# Patient Record
Sex: Female | Born: 1935 | Race: Asian | Hispanic: No | Smoking: Never smoker
Health system: Southern US, Community
[De-identification: ages and names within clinical notes are randomized; demographics above are authoritative.]

## PROBLEM LIST (undated history)

## (undated) DIAGNOSIS — E039 Hypothyroidism, unspecified: Secondary | ICD-10-CM

## (undated) DIAGNOSIS — M1990 Unspecified osteoarthritis, unspecified site: Secondary | ICD-10-CM

## (undated) DIAGNOSIS — J45909 Unspecified asthma, uncomplicated: Secondary | ICD-10-CM

## (undated) HISTORY — PX: EYE SURGERY: SHX253

## (undated) HISTORY — DX: Hypothyroidism, unspecified: E03.9

## (undated) HISTORY — PX: SPINE SURGERY: SHX786

## (undated) HISTORY — PX: APPENDECTOMY: SHX54

## (undated) HISTORY — DX: Unspecified osteoarthritis, unspecified site: M19.90

## (undated) HISTORY — DX: Unspecified asthma, uncomplicated: J45.909

---

## 2015-12-31 ENCOUNTER — Encounter: Payer: Self-pay | Admitting: *Deleted

## 2016-06-28 ENCOUNTER — Ambulatory Visit: Payer: Self-pay

## 2016-11-30 ENCOUNTER — Encounter: Payer: Self-pay | Admitting: *Deleted

## 2016-11-30 DIAGNOSIS — Z23 Encounter for immunization: Secondary | ICD-10-CM

## 2017-04-15 ENCOUNTER — Ambulatory Visit: Payer: BLUE CROSS/BLUE SHIELD | Admitting: Physician Assistant

## 2017-04-15 ENCOUNTER — Other Ambulatory Visit: Payer: Self-pay

## 2017-04-15 ENCOUNTER — Encounter: Payer: Self-pay | Admitting: Physician Assistant

## 2017-04-15 VITALS — BP 112/60 | HR 83 | Temp 98.1°F | Resp 18 | Wt 107.0 lb

## 2017-04-15 DIAGNOSIS — G479 Sleep disorder, unspecified: Secondary | ICD-10-CM | POA: Diagnosis not present

## 2017-04-15 DIAGNOSIS — R198 Other specified symptoms and signs involving the digestive system and abdomen: Secondary | ICD-10-CM | POA: Diagnosis not present

## 2017-04-15 DIAGNOSIS — Z7689 Persons encountering health services in other specified circumstances: Secondary | ICD-10-CM

## 2017-04-15 DIAGNOSIS — E039 Hypothyroidism, unspecified: Secondary | ICD-10-CM

## 2017-04-15 DIAGNOSIS — K219 Gastro-esophageal reflux disease without esophagitis: Secondary | ICD-10-CM | POA: Diagnosis not present

## 2017-04-15 NOTE — Patient Instructions (Addendum)
It was a pleasure meeting you today.  For your stomach issues, I placed a referral for gastroenterologist.  They should contact you within 1-2 weeks to schedule this appointment.  Please discuss with them the medications that you were on in [place] and for what reasons.  Otherwise, I have given you some information below about GERD and constipation.   For other issues, I recommend you return in 1-2 weeks for a complete physical exam.  It is best that you come when you are fasting, no food for 8 hours prior.  We will obtain labs and update your health maintenance requirements at this time.  We will also refill any medications you need at that time.  If you could bring any records you have from [place] or any immunization records, that would be great.  Thank you for letting me participate in your health and well being.    Food Choices for Gastroesophageal Reflux Disease, Adult When you have gastroesophageal reflux disease (GERD), the foods you eat and your eating habits are very important. Choosing the right foods can help ease your discomfort. What guidelines do I need to follow?  Choose fruits, vegetables, whole grains, and low-fat dairy products.  Choose low-fat meat, fish, and poultry.  Limit fats such as oils, salad dressings, butter, nuts, and avocado.  Keep a food diary. This helps you identify foods that cause symptoms.  Avoid foods that cause symptoms. These may be different for everyone.  Eat small meals often instead of 3 large meals a day.  Eat your meals slowly, in a place where you are relaxed.  Limit fried foods.  Cook foods using methods other than frying.  Avoid drinking alcohol.  Avoid drinking large amounts of liquids with your meals.  Avoid bending over or lying down until 2-3 hours after eating. What foods are not recommended? These are some foods and drinks that may make your symptoms worse: Vegetables Tomatoes. Tomato juice. Tomato and spaghetti sauce.  Chili peppers. Onion and garlic. Horseradish. Fruits Oranges, grapefruit, and lemon (fruit and juice). Meats High-fat meats, fish, and poultry. This includes hot dogs, ribs, ham, sausage, salami, and bacon. Dairy Whole milk and chocolate milk. Sour cream. Cream. Butter. Ice cream. Cream cheese. Drinks Coffee and tea. Bubbly (carbonated) drinks or energy drinks. Condiments Hot sauce. Barbecue sauce. Sweets/Desserts Chocolate and cocoa. Donuts. Peppermint and spearmint. Fats and Oils High-fat foods. This includes [place] fries and potato chips. Other Vinegar. Strong spices. This includes black pepper, white pepper, red pepper, cayenne, curry powder, cloves, ginger, and chili powder. The items listed above may not be a complete list of foods and drinks to avoid. Contact your dietitian for more information. This information is not intended to replace advice given to you by your health care provider. Make sure you discuss any questions you have with your health care provider. Document Released: 09/02/2011 Document Revised: 08/09/2015 Document Reviewed: 01/05/2013 Elsevier Interactive Patient Education  2017 [employer].  Constipation, Adult Constipation is when a person:  Poops (has a bowel movement) fewer times in a week than normal.  Has a hard time pooping.  Has poop that is dry, hard, or bigger than normal.  Follow these instructions at home: Eating and drinking   Eat foods that have a lot of fiber, such as: ? Fresh fruits and vegetables. ? Whole grains. ? Beans.  Eat less of foods that are high in fat, low in fiber, or overly processed, such as: ? [place] fries. ? Hamburgers. ? Cookies. ?  Candy. ? Soda.  Drink enough fluid to keep your pee (urine) clear or pale yellow. General instructions  Exercise regularly or as told by your doctor.  Go to the restroom when you feel like you need to poop. Do not hold it in.  Take over-the-counter and prescription medicines  only as told by your doctor. These include any fiber supplements.  Do pelvic floor retraining exercises, such as: ? Doing deep breathing while relaxing your lower belly (abdomen). ? Relaxing your pelvic floor while pooping.  Watch your condition for any changes.  Keep all follow-up visits as told by your doctor. This is important. Contact a doctor if:  You have pain that gets worse.  You have a fever.  You have not pooped for 4 days.  You throw up (vomit).  You are not hungry.  You lose weight.  You are bleeding from the anus.  You have thin, pencil-like poop (stool). Get help right away if:  You have a fever, and your symptoms suddenly get worse.  You leak poop or have blood in your poop.  Your belly feels hard or bigger than normal (is bloated).  You have very bad belly pain.  You feel dizzy or you faint. This information is not intended to replace advice given to you by your health care provider. Make sure you discuss any questions you have with your health care provider. Document Released: 08/20/2007 Document Revised: 09/21/2015 Document Reviewed: 08/22/2015 Elsevier Interactive Patient Education  2018 [employer].  IF you received an x-ray today, you will receive an invoice from [facility]. Please contact [facility] at [phone] with questions or concerns regarding your invoice.   IF you received labwork today, you will receive an invoice from [facility]. Please contact [facility] at [phone] with questions or concerns regarding your invoice.   Our billing staff will not be able to assist you with questions regarding bills from these companies.  You will be contacted with the lab results as soon as they are available. The fastest way to get your results is to activate your My Chart account. Instructions are located on the last page of this paperwork. If you have not heard from [name] regarding the results in 2 weeks, please contact this  office.

## 2017-04-15 NOTE — Progress Notes (Signed)
Kendra Dickson  MRN: [id number] DOB: Nov 06, 1930  Subjective:  Kendra Dickson is a 84 y.o. female seen in office today for a chief complaint of to establish care. She moved here from [place] in 2017. Received all of her medications from a doctor when she first arrived but then never followed up to establish with a PCP. Of note, pt is a poor historian. Her daughter-in-law is present to translate.Patient lives at home with son and daughter-in-law.  Chronic medical conditions she is currently taking medication for her listed below: 1) Hypothyroidism: Dx made about 10 years ago. Takes Synthroid 50 mcg daily.    2) Bowel issues: Has history of constipation, diarrhea, and GERD.  Was on metoclopramide TID x years in [place] but is not sure why? She has been out of medication for one day.Has been having at least 2 bowel movements daily. Also takes omeprazole 20mg  x >1 year. She does have heartburn especially if she eats spicy food.. She had an established GI doctor in [place]. Had endoscopy in the past about 2 years ago and notes it was normal.  Diet consists of rice and soup. Only drinks one cup of water a day.  Denies abdominal pain, nausea, and vomiting.   3) Sleep issues: Takes trazodone 50mg  at night for sleep disturbance.  Has done this for years.  Notes that the helps tremendously with sleep.    Review of Systems  Constitutional: Negative for chills and fever.  Respiratory: Negative for cough and shortness of breath.   Musculoskeletal: Negative for gait problem.  Neurological: Negative for dizziness.    There are no active problems to display for this patient.   Current Outpatient Medications on File Prior to Visit  Medication Sig Dispense Refill  . aspirin 81 MG chewable tablet Chew by mouth daily.    . Cholecalciferol (D3-1000) 1000 units capsule Take 1,000 Units by mouth daily.    Marland Kitchen levothyroxine (SYNTHROID, LEVOTHROID) 50 MCG tablet Take 50 mcg by mouth daily before breakfast.    .  Lutein-Zeaxanthin 25-5 MG CAPS Take by mouth.    . magnesium oxide (MAG-OX) 400 MG tablet Take 400 mg by mouth daily.    . metoCLOPramide (REGLAN) 10 MG tablet Take 10 mg by mouth 3 (three) times daily.    Marland Kitchen omeprazole (PRILOSEC) 20 MG capsule Take 20 mg by mouth daily.    . traZODone (DESYREL) 50 MG tablet Take 50 mg by mouth at bedtime.     No current facility-administered medications on file prior to visit.     No Known Allergies   Objective:  BP 112/60   Pulse 83   Temp 98.1 F (36.7 C) (Oral)   Resp 18   Wt 107 lb (48.5 kg)   SpO2 94%   Physical Exam  Constitutional: She is oriented to person, place, and time and well-developed, well-nourished, and in no distress.  HENT:  Head: Normocephalic and atraumatic.  Eyes: Conjunctivae are normal.  Neck: Normal range of motion.  Pulmonary/Chest: Effort normal.  Abdominal: Soft. Normal appearance and bowel sounds are normal. There is no tenderness.  Neurological: She is alert and oriented to person, place, and time. Gait normal.  Skin: Skin is warm and dry.  Psychiatric: Affect normal.  Vitals reviewed.   Assessment and Plan :  1. Gastroesophageal reflux disease, esophagitis presence not specified Due to extensive bowel issues and long-term use of both metoclopramide and omeprazole, patient was consistently followed by GI in [place] would likely benefit from  establishing with a GI doctor here.  Of note, patient is a poor historian.  Referral placed. - Ambulatory referral to Gastroenterology 2. Alternating constipation and diarrhea - Ambulatory referral to Gastroenterology 3. Hypothyroidism, unspecified type 4. Sleep disturbance 5. Encounter to establish care We are happy to take over as patient's primary care office.  Recommended she follow-up within the next 1-2 weeks when she is fasting for CPE.  Educated daughter-in-law that if she could bring in any prior medical records or immunization records at that visit, that would be  extremely helpful.  Tenna Delaine PA-C  [facility] 04/17/2017 9:32 AM

## 2017-04-17 ENCOUNTER — Encounter: Payer: Self-pay | Admitting: Physician Assistant

## 2017-04-27 ENCOUNTER — Ambulatory Visit: Payer: BLUE CROSS/BLUE SHIELD | Admitting: Physician Assistant

## 2017-04-28 ENCOUNTER — Encounter: Payer: Self-pay | Admitting: Gastroenterology

## 2017-04-29 ENCOUNTER — Ambulatory Visit (INDEPENDENT_AMBULATORY_CARE_PROVIDER_SITE_OTHER): Payer: BLUE CROSS/BLUE SHIELD | Admitting: Physician Assistant

## 2017-04-29 ENCOUNTER — Encounter: Payer: Self-pay | Admitting: Physician Assistant

## 2017-04-29 ENCOUNTER — Other Ambulatory Visit: Payer: Self-pay

## 2017-04-29 VITALS — BP 122/56 | HR 76 | Temp 97.6°F | Resp 16 | Ht <= 58 in | Wt 104.4 lb

## 2017-04-29 DIAGNOSIS — E039 Hypothyroidism, unspecified: Secondary | ICD-10-CM

## 2017-04-29 DIAGNOSIS — Z0000 Encounter for general adult medical examination without abnormal findings: Secondary | ICD-10-CM

## 2017-04-29 DIAGNOSIS — G479 Sleep disorder, unspecified: Secondary | ICD-10-CM | POA: Diagnosis not present

## 2017-04-29 DIAGNOSIS — Z13220 Encounter for screening for lipoid disorders: Secondary | ICD-10-CM | POA: Diagnosis not present

## 2017-04-29 DIAGNOSIS — Z13228 Encounter for screening for other metabolic disorders: Secondary | ICD-10-CM

## 2017-04-29 DIAGNOSIS — E2839 Other primary ovarian failure: Secondary | ICD-10-CM

## 2017-04-29 DIAGNOSIS — Z23 Encounter for immunization: Secondary | ICD-10-CM

## 2017-04-29 DIAGNOSIS — M810 Age-related osteoporosis without current pathological fracture: Secondary | ICD-10-CM | POA: Diagnosis not present

## 2017-04-29 DIAGNOSIS — Z1389 Encounter for screening for other disorder: Secondary | ICD-10-CM

## 2017-04-29 DIAGNOSIS — Z130 Encounter for screening for diseases of the blood and blood-forming organs and certain disorders involving the immune mechanism: Secondary | ICD-10-CM

## 2017-04-29 MED ORDER — TRAZODONE HCL 50 MG PO TABS: 50.0000 mg | ORAL_TABLET | Freq: Every day | ORAL | 1 refills | Status: DC

## 2017-04-29 MED ORDER — LEVOTHYROXINE SODIUM 50 MCG PO TABS: 50.0000 ug | ORAL_TABLET | Freq: Every day | ORAL | 1 refills | Status: DC

## 2017-04-29 NOTE — Progress Notes (Addendum)
Kendra Dickson  MRN: [id number] DOB: 13-Nov-1930  Subjective:  Pt is a 84 y.o. postmenopausal female who presents for annual physical exam. Pt is not fasting today.   Diet: Eats a good mix of rice, meats, and vegetables. Eats yogurt regularly. Takes OTC vitamin d and calcium supplementation. Drinks juice and water.   Exercise: Walks around a few times a day outside.  Last annual exam: Years ago Last dental exam: 2018, brushes twice daily. Has lower dentures.  Last vision exam:  2018, wears eyeglasses  Last pap smear: Years ago Last mammogram: 2009 Last colonoscopy: 2017 Vaccinations      Tetanus: 2017       Zostavax: Never      Influenza: 11/2016      Pneumococcal: notes she had one shot in [place] but does not remember which one  Chronic issues: 1) Osteoporosis: Was on shots in [place] x 4 years. Came here 17 months ago, started on an oral medication but cannot recall which one. Did not tolerate it due to stomach issues, and therefore stopped taking it. Has not had a DEXA in 4 years. 2) Hypothyroidism: controlled on synthroid 66mg. Has not bee out of medication.  3) asthma: does not require an inhaler, much better now [place] moved to [place] 4) Sleep issues: Takes trazodone '50mg'$  x 6 months. Helps a lot. Gets 4-5 hours a night.  5) Bowel issues: Has history of constipation, diarrhea, and GERD.  Was [place] metoclopramide 3 times daily times [place] why.  Takes omeprazole 20 mg daily.  [place]  Has not established one here yet but was given referral at her last visit.  Last endoscopy was 2 years ago and she notes was normal.  Has follow up with GI in 05/2017.  There are no active problems to display for this patient.   Current Outpatient Medications on File Prior to Visit  Medication Sig Dispense Refill  . aspirin 81 MG chewable tablet Chew by mouth daily.    . Cholecalciferol (D3-1000) 1000 units capsule Take 1,000 Units by mouth daily.    .  Lutein-Zeaxanthin 25-5 MG CAPS Take by mouth.    . magnesium oxide (MAG-OX) 400 MG tablet Take 400 mg by mouth daily.    . metoCLOPramide (REGLAN) 10 MG tablet Take 10 mg by mouth 3 (three) times daily.    .Marland Kitchenomeprazole (PRILOSEC) 20 MG capsule Take 20 mg by mouth daily.     No current facility-administered medications on file prior to visit.     No Known Allergies  Social History   Socioeconomic History  . Marital status: Widowed    Spouse name: None  . Number of children: 3  . Years of education: None  . Highest education level: None  Social Needs  . Financial resource strain: Not hard at all  . Food insecurity - worry: Never true  . Food insecurity - inability: Never true  . Transportation needs - medical: No  . Transportation needs - non-medical: No  Occupational History  . None  Tobacco Use  . Smoking status: Never Smoker  . Smokeless tobacco: Never Used  Substance and Sexual Activity  . Alcohol use: No    Frequency: Never  . Drug use: No  . Sexual activity: No  Other Topics Concern  . None  Social History Narrative   [place]. Lives with son and daughter in law. Has  three children.     Past Surgical History:  Procedure Laterality Date  . APPENDECTOMY    . CESAREAN SECTION    . EYE SURGERY    . SPINE SURGERY      Family History  Problem Relation Age of Onset  . Cancer Father        Gallbladder  . Cancer Sister        Gallbladder   Fall Risk  04/29/2017 04/15/2017  Falls in the past year? No No    Review of Systems  Constitutional: Negative for activity change, appetite change, chills, diaphoresis, fatigue, fever and unexpected weight change.  HENT: Positive for dental problem (has dentures) and hearing loss (used to use hearing aids but did not like them). Negative for congestion, drooling, ear discharge, ear pain, facial swelling, mouth sores, nosebleeds, postnasal drip, rhinorrhea, sinus pressure, sinus pain, sneezing, sore  throat, tinnitus, trouble swallowing and voice change.   Eyes: Positive for visual disturbance (has Rx eyeglasses). Negative for photophobia, pain, discharge, redness and itching.  Respiratory: Negative for apnea, cough, choking, chest tightness, shortness of breath, wheezing and stridor.   Cardiovascular: Negative for chest pain, palpitations and leg swelling.  Gastrointestinal: Positive for diarrhea. Negative for abdominal distention, abdominal pain, anal bleeding, blood in stool, constipation, nausea, rectal pain and vomiting.  Endocrine: Negative for cold intolerance, heat intolerance, polydipsia, polyphagia and polyuria.  Genitourinary: Negative for decreased urine volume, difficulty urinating, dyspareunia, dysuria, enuresis, flank pain, frequency, genital sores, hematuria, menstrual problem, pelvic pain, urgency, vaginal bleeding, vaginal discharge and vaginal pain.  Musculoskeletal: Negative for arthralgias, back pain, gait problem, joint swelling, myalgias, neck pain and neck stiffness.  Skin: Negative for color change, pallor, rash and wound.  Allergic/Immunologic: Negative for environmental allergies, food allergies and immunocompromised state.  Neurological: Negative for dizziness, tremors, seizures, syncope, facial asymmetry, speech difficulty, weakness, light-headedness, numbness and headaches.  Hematological: Negative for adenopathy. Does not bruise/bleed easily.  Psychiatric/Behavioral: Negative for agitation, behavioral problems, confusion, decreased concentration, dysphoric mood, hallucinations, self-injury, sleep disturbance and suicidal ideas. The patient is not nervous/anxious and is not hyperactive.     Objective:  BP (!) 122/56   Pulse 76   Temp 97.6 F (36.4 C) (Oral)   Resp 16   Ht 4' 9.28" (1.455 m)   Wt 104 lb 6.4 oz (47.4 kg)   LMP  (Approximate)   SpO2 95%   BMI 22.37 kg/m   Physical Exam  Constitutional: She is oriented to person, place, and time and  well-developed, well-nourished, and in no distress.  HENT:  Head: Normocephalic and atraumatic.  Right Ear: Hearing, tympanic membrane, external ear and ear canal normal.  Left Ear: Hearing, tympanic membrane, external ear and ear canal normal.  Nose: Nose normal.  Mouth/Throat: Uvula is midline, oropharynx is clear and moist and mucous membranes are normal. No oropharyngeal exudate.  Eyes: Conjunctivae, EOM and lids are normal. Pupils are equal, round, and reactive to light. No scleral icterus.  Neck: Trachea normal and normal range of motion. No thyroid mass and no thyromegaly present.  Cardiovascular: Normal rate, regular rhythm, normal heart sounds and intact distal pulses.  Pulmonary/Chest: Effort normal and breath sounds normal.  Abdominal: Soft. Normal appearance and bowel sounds are normal. There is no tenderness.  Lymphadenopathy:       Head (right side): No tonsillar, no preauricular, no posterior auricular and no occipital adenopathy present.       Head (left side): No tonsillar, no preauricular, no posterior auricular and no  occipital adenopathy present.    She has no cervical adenopathy.       Right: No supraclavicular adenopathy present.       Left: No supraclavicular adenopathy present.  Neurological: She is alert and oriented to person, place, and time. She has normal sensation, normal strength and normal reflexes. Gait normal.  30 second chair stand test score of 10.  Skin: Skin is warm and dry.  Psychiatric: Affect normal.   No exam data present   Assessment and Plan :  Discussed healthy lifestyle, diet, exercise, preventative care, vaccinations, and addressed patient's concerns. Plan for follow up in 6 months. Otherwise, plan for specific conditions below.  1. Annual physical exam Await lab results.  2. Screening, anemia, deficiency, iron - CBC with Differential  3. Screening for metabolic disorder - [id number]  4. Screening cholesterol level - Lipid  Panel  5. Screening for hematuria or proteinuria - Urinalysis, dipstick only  6. Hypothyroidism, unspecified type Follow-up in 6 months. - TSH - levothyroxine (SYNTHROID, LEVOTHROID) 50 MCG tablet; Take 1 tablet (50 mcg total) by mouth daily before breakfast.  Dispense: 90 tablet; Refill: 1 - [facility] Bone Density; Future  7. Osteoporosis, unspecified osteoporosis type, unspecified pathological fracture presence - [facility] Bone Density; Future - VITAMIN D 25 Hydroxy (Vit-D Deficiency, Fractures)  8. Need for pneumococcal vaccination Will give pneumovax 23 in one year.  - Pneumococcal conjugate vaccine 13-valent  9. Sleep disturbance Controlled.  - traZODone (DESYREL) 50 MG tablet; Take 1 tablet (50 mg total) by mouth at bedtime.  Dispense: 90 tablet; Refill: 1  10. Estrogen deficiency - [facility]; Future   Tenna Delaine, PA-C  [facility] at [facility] 04/29/2017 6:44 PM

## 2017-04-29 NOTE — Patient Instructions (Addendum)
We have collected labs and should have those results back in 1 week.  We will contact you with these results.  In the meantime, continue with your current medications.  I have given you refills on Synthroid and trazodone.  Follow-up with GI as planned.  We have also ordered a bone density today and they should contact you within 1-2 weeks to schedule this.  Plan to follow-up in 6 months for reevaluation.  Health Maintenance for Postmenopausal Women Menopause is a normal process in which your reproductive ability comes to an end. This process happens gradually over a span of months to years, usually between the ages of 42 and 61. Menopause is complete when you have missed 12 consecutive menstrual periods. It is important to talk with your health care provider about some of the most common conditions that affect postmenopausal women, such as heart disease, cancer, and bone loss (osteoporosis). Adopting a healthy lifestyle and getting preventive care can help to promote your health and wellness. Those actions can also lower your chances of developing some of these common conditions. What should I know about menopause? During menopause, you may experience a number of symptoms, such as:  Moderate-to-severe hot flashes.  Night sweats.  Decrease in sex drive.  Mood swings.  Headaches.  Tiredness.  Irritability.  Memory problems.  Insomnia.  Choosing to treat or not to treat menopausal changes is an individual decision that you make with your health care provider. What should I know about hormone replacement therapy and supplements? Hormone therapy products are effective for treating symptoms that are associated with menopause, such as hot flashes and night sweats. Hormone replacement carries certain risks, especially as you become older. If you are thinking about using estrogen or estrogen with progestin treatments, discuss the benefits and risks with your health care provider. What should I  know about heart disease and stroke? Heart disease, heart attack, and stroke become more likely as you age. This may be due, in part, to the hormonal changes that your body experiences during menopause. These can affect how your body processes dietary fats, triglycerides, and cholesterol. Heart attack and stroke are both medical emergencies. There are many things that you can do to help prevent heart disease and stroke:  Have your blood pressure checked at least every 1-2 years. High blood pressure causes heart disease and increases the risk of stroke.  If you are 25-28 years old, ask your health care provider if you should take aspirin to prevent a heart attack or a stroke.  Do not use any tobacco products, including cigarettes, chewing tobacco, or electronic cigarettes. If you need help quitting, ask your health care provider.  It is important to eat a healthy diet and maintain a healthy weight. ? Be sure to include plenty of vegetables, fruits, low-fat dairy products, and lean protein. ? Avoid eating foods that are high in solid fats, added sugars, or salt (sodium).  Get regular exercise. This is one of the most important things that you can do for your health. ? Try to exercise for at least 150 minutes each week. The type of exercise that you do should increase your heart rate and make you sweat. This is known as moderate-intensity exercise. ? Try to do strengthening exercises at least twice each week. Do these in addition to the moderate-intensity exercise.  Know your numbers.Ask your health care provider to check your cholesterol and your blood glucose. Continue to have your blood tested as directed by your health care  provider.  What should I know about cancer screening? There are several types of cancer. Take the following steps to reduce your risk and to catch any cancer development as early as possible. Breast Cancer  Practice breast self-awareness. ? This means understanding how  your breasts normally appear and feel. ? It also means doing regular breast self-exams. Let your health care provider know about any changes, no matter how small.  If you are 12 or older, have a clinician do a breast exam (clinical breast exam or CBE) every year. Depending on your age, family history, and medical history, it may be recommended that you also have a yearly breast X-ray (mammogram).  If you have a family history of breast cancer, talk with your health care provider about genetic screening.  If you are at high risk for breast cancer, talk with your health care provider about having an MRI and a mammogram every year.  Breast cancer (BRCA) gene test is recommended for women who have family members with BRCA-related cancers. Results of the assessment will determine the need for genetic counseling and BRCA1 and for BRCA2 testing. BRCA-related cancers include these types: ? Breast. This occurs in males or females. ? Ovarian. ? Tubal. This may also be called fallopian tube cancer. ? Cancer of the abdominal or pelvic lining (peritoneal cancer). ? Prostate. ? Pancreatic.  Cervical, Uterine, and Ovarian Cancer Your health care provider may recommend that you be screened regularly for cancer of the pelvic organs. These include your ovaries, uterus, and vagina. This screening involves a pelvic exam, which includes checking for microscopic changes to the surface of your cervix (Pap test).  For women ages 21-65, health care providers may recommend a pelvic exam and a Pap test every three years. For women ages 52-65, they may recommend the Pap test and pelvic exam, combined with testing for human papilloma virus (HPV), every five years. Some types of HPV increase your risk of cervical cancer. Testing for HPV may also be done on women of any age who have unclear Pap test results.  Other health care providers may not recommend any screening for nonpregnant women who are considered low risk for  pelvic cancer and have no symptoms. Ask your health care provider if a screening pelvic exam is right for you.  If you have had past treatment for cervical cancer or a condition that could lead to cancer, you need Pap tests and screening for cancer for at least 20 years after your treatment. If Pap tests have been discontinued for you, your risk factors (such as having a new sexual partner) need to be reassessed to determine if you should start having screenings again. Some women have medical problems that increase the chance of getting cervical cancer. In these cases, your health care provider may recommend that you have screening and Pap tests more often.  If you have a family history of uterine cancer or ovarian cancer, talk with your health care provider about genetic screening.  If you have vaginal bleeding after reaching menopause, tell your health care provider.  There are currently no reliable tests available to screen for ovarian cancer.  Lung Cancer Lung cancer screening is recommended for adults 85-36 years old who are at high risk for lung cancer because of a history of smoking. A yearly low-dose CT scan of the lungs is recommended if you:  Currently smoke.  Have a history of at least 30 pack-years of smoking and you currently smoke or have quit within  the past 15 years. A pack-year is smoking an average of one pack of cigarettes per day for one year.  Yearly screening should:  Continue until it has been 15 years since you quit.  Stop if you develop a health problem that would prevent you from having lung cancer treatment.  Colorectal Cancer  This type of cancer can be detected and can often be prevented.  Routine colorectal cancer screening usually begins at age 77 and continues through age 31.  If you have risk factors for colon cancer, your health care provider may recommend that you be screened at an earlier age.  If you have a family history of colorectal cancer, talk  with your health care provider about genetic screening.  Your health care provider may also recommend using home test kits to check for hidden blood in your stool.  A small camera at the end of a tube can be used to examine your colon directly (sigmoidoscopy or colonoscopy). This is done to check for the earliest forms of colorectal cancer.  Direct examination of the colon should be repeated every 5-10 years until age 79. However, if early forms of precancerous polyps or small growths are found or if you have a family history or genetic risk for colorectal cancer, you may need to be screened more often.  Skin Cancer  Check your skin from head to toe regularly.  Monitor any moles. Be sure to tell your health care provider: ? About any new moles or changes in moles, especially if there is a change in a mole's shape or color. ? If you have a mole that is larger than the size of a pencil eraser.  If any of your family members has a history of skin cancer, especially at a young age, talk with your health care provider about genetic screening.  Always use sunscreen. Apply sunscreen liberally and repeatedly throughout the day.  Whenever you are outside, protect yourself by wearing long sleeves, pants, a wide-brimmed hat, and sunglasses.  What should I know about osteoporosis? Osteoporosis is a condition in which bone destruction happens more quickly than new bone creation. After menopause, you may be at an increased risk for osteoporosis. To help prevent osteoporosis or the bone fractures that can happen because of osteoporosis, the following is recommended:  If you are 85-22 years old, get at least 1,000 mg of calcium and at least 600 mg of vitamin D per day.  If you are older than age 78 but younger than age 69, get at least 1,200 mg of calcium and at least 600 mg of vitamin D per day.  If you are older than age 50, get at least 1,200 mg of calcium and at least 800 mg of vitamin D per  day.  Smoking and excessive alcohol intake increase the risk of osteoporosis. Eat foods that are rich in calcium and vitamin D, and do weight-bearing exercises several times each week as directed by your health care provider. What should I know about how menopause affects my mental health? Depression may occur at any age, but it is more common as you become older. Common symptoms of depression include:  Low or sad mood.  Changes in sleep patterns.  Changes in appetite or eating patterns.  Feeling an overall lack of motivation or enjoyment of activities that you previously enjoyed.  Frequent crying spells.  Talk with your health care provider if you think that you are experiencing depression. What should I know about immunizations? It  is important that you get and maintain your immunizations. These include:  Tetanus, diphtheria, and pertussis (Tdap) booster vaccine.  Influenza every year before the flu season begins.  Pneumonia vaccine.  Shingles vaccine.  Your health care provider may also recommend other immunizations. This information is not intended to replace advice given to you by your health care provider. Make sure you discuss any questions you have with your health care provider. Document Released: 04/25/2005 Document Revised: 09/21/2015 Document Reviewed: 12/05/2014 Elsevier Interactive Patient Education  2018 [employer].    IF you received an x-ray today, you will receive an invoice from [facility]. Please contact [facility] at [phone] with questions or concerns regarding your invoice.   IF you received labwork today, you will receive an invoice from [facility]. Please contact [facility] at [phone] with questions or concerns regarding your invoice.   Our billing staff will not be able to assist you with questions regarding bills from these companies.  You will be contacted with the lab results as soon as they are available. The fastest  way to get your results is to activate your My Chart account. Instructions are located on the last page of this paperwork. If you have not heard from [name] regarding the results in 2 weeks, please contact this office.

## 2017-04-30 LAB — CBC WITH DIFFERENTIAL/PLATELET
BASOS: 0 %
Basophils Absolute: 0 10*3/uL (ref 0.0–0.2)
EOS (ABSOLUTE): 0.1 10*3/uL (ref 0.0–0.4)
EOS: 1 %
HEMATOCRIT: 29.5 % — AB (ref 34.0–46.6)
Hemoglobin: 9.9 g/dL — ABNORMAL LOW (ref 11.1–15.9)
Immature Grans (Abs): 0 10*3/uL (ref 0.0–0.1)
Immature Granulocytes: 0 %
LYMPHS ABS: 1.3 10*3/uL (ref 0.7–3.1)
Lymphs: 35 %
MCH: 30.2 pg (ref 26.6–33.0)
MCHC: 33.6 g/dL (ref 31.5–35.7)
MCV: 90 fL (ref 79–97)
MONOS ABS: 0.5 10*3/uL (ref 0.1–0.9)
Monocytes: 15 %
Neutrophils Absolute: 1.8 10*3/uL (ref 1.4–7.0)
Neutrophils: 49 %
Platelets: 251 10*3/uL (ref 150–379)
RBC: 3.28 x10E6/uL — ABNORMAL LOW (ref 3.77–5.28)
RDW: 13.1 % (ref 12.3–15.4)
WBC: 3.7 10*3/uL (ref 3.4–10.8)

## 2017-04-30 LAB — CMP14+EGFR
A/G RATIO: 1.6 (ref 1.2–2.2)
ALT: 13 IU/L (ref 0–32)
AST: 18 IU/L (ref 0–40)
Albumin: 3.7 g/dL (ref 3.5–4.7)
Alkaline Phosphatase: 58 IU/L (ref 39–117)
BILIRUBIN TOTAL: 0.2 mg/dL (ref 0.0–1.2)
BUN/Creatinine Ratio: 17 (ref 12–28)
BUN: 12 mg/dL (ref 8–27)
CHLORIDE: 102 mmol/L (ref 96–106)
CO2: 25 mmol/L (ref 20–29)
Calcium: 8.6 mg/dL — ABNORMAL LOW (ref 8.7–10.3)
Creatinine, Ser: 0.69 mg/dL (ref 0.57–1.00)
GFR calc Af Amer: 92 mL/min/{1.73_m2} (ref >59)
GFR, EST NON AFRICAN AMERICAN: 80 mL/min/{1.73_m2} (ref >59)
GLOBULIN, TOTAL: 2.3 g/dL (ref 1.5–4.5)
Glucose: 117 mg/dL — ABNORMAL HIGH (ref 65–99)
POTASSIUM: 4.2 mmol/L (ref 3.5–5.2)
SODIUM: 138 mmol/L (ref 134–144)
Total Protein: 6 g/dL (ref 6.0–8.5)

## 2017-04-30 LAB — URINALYSIS, DIPSTICK ONLY
BILIRUBIN UA: NEGATIVE
GLUCOSE, UA: NEGATIVE
Ketones, UA: NEGATIVE
Leukocytes, UA: NEGATIVE
Nitrite, UA: NEGATIVE
Protein, UA: NEGATIVE
RBC, UA: NEGATIVE
Specific Gravity, UA: 1.017 (ref 1.005–1.030)
UUROB: 0.2 mg/dL (ref 0.2–1.0)
pH, UA: 7.5 (ref 5.0–7.5)

## 2017-04-30 LAB — LIPID PANEL
CHOL/HDL RATIO: 3.7 ratio (ref 0.0–4.4)
Cholesterol, Total: 177 mg/dL (ref 100–199)
HDL: 48 mg/dL (ref >39)
LDL Calculated: 112 mg/dL — ABNORMAL HIGH (ref 0–99)
TRIGLYCERIDES: 86 mg/dL (ref 0–149)
VLDL Cholesterol Cal: 17 mg/dL (ref 5–40)

## 2017-04-30 LAB — TSH: TSH: 1.1 u[IU]/mL (ref 0.450–4.500)

## 2017-04-30 LAB — VITAMIN D 25 HYDROXY (VIT D DEFICIENCY, FRACTURES): VIT D 25 HYDROXY: 66.2 ng/mL (ref 30.0–100.0)

## 2017-05-08 ENCOUNTER — Other Ambulatory Visit: Payer: Self-pay | Admitting: Physician Assistant

## 2017-05-08 DIAGNOSIS — D649 Anemia, unspecified: Secondary | ICD-10-CM

## 2017-05-08 NOTE — Progress Notes (Signed)
Orders Placed This Encounter  Procedures  . Iron, TIBC and Ferritin Panel    Standing Status:   Future    Standing Expiration Date:   06/19/2017  . Pathologist smear review    Standing Status:   Future    Standing Expiration Date:   06/19/2017  . Reticulocytes    Standing Status:   Future    Standing Expiration Date:   06/19/2017  . Haptoglobin    Standing Status:   Future    Standing Expiration Date:   06/19/2017  . Lactate Dehydrogenase    Standing Status:   Future    Standing Expiration Date:   06/19/2017

## 2017-06-09 ENCOUNTER — Telehealth: Payer: Self-pay | Admitting: Physician Assistant

## 2017-06-09 NOTE — Telephone Encounter (Signed)
Copied from [place] [id number]. Topic: Quick Communication - See Telephone Encounter >> Jun 09, 2017  2:53 PM Lolita Rieger, RMA wrote: CRM for notification. See Telephone encounter for: 06/09/17.Allie from breast center needs a new dx placed for the bone density order please placed new order and call [phone]

## 2017-06-10 NOTE — Telephone Encounter (Signed)
Spoke with Tonya from [facility] [place]. She said they are unable to use Dx of Osteoporosis because she does not have Medicare and are afraid BCBS will not cover this. She said the Dx can be changed to estrogen deficiency, osteopenia, vertebral fracture, hyperthyroidism, hyperparathyroidism, or hypothyroidism. She said we can check with insurance to see if they will cover osteoporosis Dx but they generally do not if not Medicare. Please advise. Thanks!

## 2017-06-10 NOTE — Telephone Encounter (Signed)
Kendra Dickson,   I went back to the visit and added dx of estrogen deficiency. I then associated bone density to: 1) estrogen def 2) hypothyroidism and 3) osteoporosis. So hopefully this works? Please let me know.

## 2017-06-11 ENCOUNTER — Ambulatory Visit: Payer: BLUE CROSS/BLUE SHIELD | Admitting: Gastroenterology

## 2017-06-11 ENCOUNTER — Other Ambulatory Visit (INDEPENDENT_AMBULATORY_CARE_PROVIDER_SITE_OTHER): Payer: BLUE CROSS/BLUE SHIELD

## 2017-06-11 ENCOUNTER — Encounter: Payer: Self-pay | Admitting: Gastroenterology

## 2017-06-11 VITALS — BP 98/56 | HR 68 | Ht <= 58 in | Wt 99.0 lb

## 2017-06-11 DIAGNOSIS — R1084 Generalized abdominal pain: Secondary | ICD-10-CM | POA: Diagnosis not present

## 2017-06-11 DIAGNOSIS — R197 Diarrhea, unspecified: Secondary | ICD-10-CM

## 2017-06-11 DIAGNOSIS — K219 Gastro-esophageal reflux disease without esophagitis: Secondary | ICD-10-CM | POA: Diagnosis not present

## 2017-06-11 LAB — IBC PANEL
IRON: 49 ug/dL (ref 42–145)
Saturation Ratios: 18.4 % — ABNORMAL LOW (ref 20.0–50.0)
Transferrin: 190 mg/dL — ABNORMAL LOW (ref 212.0–360.0)

## 2017-06-11 LAB — IGA: IgA: 336 mg/dL (ref 68–378)

## 2017-06-11 LAB — FERRITIN: Ferritin: 147.7 ng/mL (ref 10.0–291.0)

## 2017-06-11 LAB — FOLATE

## 2017-06-11 LAB — VITAMIN B12: Vitamin B-12: >1500 pg/mL — ABNORMAL HIGH (ref 211–911)

## 2017-06-11 MED ORDER — OMEPRAZOLE 20 MG PO CPDR: 20.0000 mg | DELAYED_RELEASE_CAPSULE | Freq: Every day | ORAL | 11 refills | Status: DC

## 2017-06-11 MED ORDER — GLYCOPYRROLATE 1 MG PO TABS: 1.0000 mg | ORAL_TABLET | Freq: Two times a day (BID) | ORAL | 11 refills | Status: DC

## 2017-06-11 NOTE — Telephone Encounter (Signed)
Sarah at [facility] advised of order change and said this should work. Thanks!

## 2017-06-11 NOTE — Patient Instructions (Signed)
If you are age 82 or older, your body mass index should be between 23-30. Your Body mass index is 21.42 kg/m. If this is out of the aforementioned range listed, please consider follow up with your Primary Care Provider.  If you are age 47 or younger, your body mass index should be between 19-25. Your Body mass index is 21.42 kg/m. If this is out of the aformentioned range listed, please consider follow up with your Primary Care Provider.   We have sent the following medications to your pharmacy for you to pick up at your convenience: Robinul 1mg  twice daily Omeprazole 20mg  daily.   Your physician has requested that you go to the basement for lab work before leaving today.  Thank you for choosing [facility]  Dr. Fuller Plan

## 2017-06-11 NOTE — Progress Notes (Signed)
History of Present Illness: This is an 84 year old [place] female referred by Tenna Delaine D, PA* for the evaluation of GERD, diarrhea, abdominal pain. Translator present for interaction today and patients daughter-in-law is present and provides some history.  Patient is not a very good historian.  Between the patient and daughter-in-law it is clear that the patient has had ongoing digestive complaints for more than 50 years.  She has seen physicians for this problem and cream multiple times and has used multiple OTC medications for symptoms.  She moved to the [place] in 2017.  Apparently had a colonoscopy was performed about 10 years ago in [place], reportedly normal.  EGD performed about 2 years ago in [place], reportedly normal.  She states omeprazole has improved some of her symptoms.  She has frequent generalized abdominal discomfort following meals and at other times along with dyspeptic and reflux symptoms.  She often has diarrhea alternating with diarrhea however recently she has been having more problems with diarrhea occurring 4-5 times per day.  Denies recent antibiotic usage.  She was recently found to have a normocytic anemia.  She takes a magnesium supplement and was previously taking metoclopramide. Denies weight loss,change in stool caliber, melena, hematochezia, nausea, vomiting, dysphagia, chest pain.    No Known Allergies Outpatient Medications Prior to Visit  Medication Sig Dispense Refill  . aspirin 81 MG chewable tablet Chew by mouth daily.    Marland Kitchen levothyroxine (SYNTHROID, LEVOTHROID) 50 MCG tablet Take 1 tablet (50 mcg total) by mouth daily before breakfast. 90 tablet 1  . Lutein-Zeaxanthin 25-5 MG CAPS Take by mouth.    . magnesium oxide (MAG-OX) 400 MG tablet Take 400 mg by mouth daily.    . traZODone (DESYREL) 50 MG tablet Take 1 tablet (50 mg total) by mouth at bedtime. 90 tablet 1  . Cholecalciferol (D3-1000) 1000 units capsule Take 1,000 Units by mouth daily.    .  metoCLOPramide (REGLAN) 10 MG tablet Take 10 mg by mouth 3 (three) times daily.    Marland Kitchen omeprazole (PRILOSEC) 20 MG capsule Take 20 mg by mouth daily.     No facility-administered medications prior to visit.    Past Medical History:  Diagnosis Date  . Arthritis   . Asthma   . Hypothyroidism    Past Surgical History:  Procedure Laterality Date  . APPENDECTOMY    . CESAREAN SECTION    . EYE SURGERY    . SPINE SURGERY     Social History   Socioeconomic History  . Marital status: Widowed    Spouse name: Not on file  . Number of children: 3  . Years of education: Not on file  . Highest education level: Not on file  Occupational History  . Not on file  Social Needs  . Financial resource strain: Not hard at all  . Food insecurity:    Worry: Never true    Inability: Never true  . Transportation needs:    Medical: No    Non-medical: No  Tobacco Use  . Smoking status: Never Smoker  . Smokeless tobacco: Never Used  Substance and Sexual Activity  . Alcohol use: No    Frequency: Never  . Drug use: No  . Sexual activity: Never  Lifestyle  . Physical activity:    Days per week: 0 days    Minutes per session: 0 min  . Stress: Not on file  Relationships  . Social connections:    Talks on phone: More  than three times a week    Gets together: Once a week    Attends religious service: More than 4 times per year    Active member of club or organization: Yes    Attends meetings of clubs or organizations: More than 4 times per year    Relationship status: Widowed  Other Topics Concern  . Not on file  Social History Narrative   She is from [place]. Moved here in 2017. Lives with son and daughter in law. Has three children.    Family History  Problem Relation Age of Onset  . Cancer Father        Gallbladder  . Pancreatic cancer Father   . Cancer Sister        Gallbladder      Review of Systems: Pertinent positive and negative review of systems were noted in the above HPI  section. All other review of systems were otherwise negative.    Physical Exam: General: Well developed, well nourished, elderly, thin, no acute distress Head: Normocephalic and atraumatic Eyes:  sclerae anicteric, EOMI Ears: Normal auditory acuity Mouth: No deformity or lesions Neck: Supple, no masses or thyromegaly Lungs: Clear throughout to auscultation Heart: Regular rate and rhythm; no murmurs, rubs or bruits Abdomen: Soft, non tender and non distended. No masses, hepatosplenomegaly or hernias noted. Normal Bowel sounds Musculoskeletal: Symmetrical with no gross deformities  Skin: No lesions on visible extremities Pulses:  Normal pulses noted Extremities: No clubbing, cyanosis, edema or deformities noted Neurological: Alert oriented x 4, grossly nonfocal Cervical Nodes:  No significant cervical adenopathy Inguinal Nodes: No significant inguinal adenopathy Psychological:  Alert and cooperative. Normal mood and affect   Assessment and Recommendations:  1. Diarrhea for several weeks.  History of diarrhea alternating with constipation.  Continue to remain off metoclopramide. DC Mg supplement. Obtain GI pathogen, tTG, IgA.  REV 4 weeks.   2.  Frequent abdominal pain with alternating bowel pattern, suspected IBS.  Avoid foods that exacerbate symptoms.  Begin glycopyrrolate 1 mg twice daily.  Consider abdominal/pelvic CT if symptoms do not substantially improve.  REV 4 weeks.   3. Normocytic anemia. Fe, TIBC, ferritin, B12, folate, stool Hemoccults. REV in 4 weeks.   4. Dyspepsia. Suspected GERD.  Follow antireflux measures.  Resume omeprazole 20 mg daily.   cc: Leonie Douglas, PA-C [place], [place] [id number]

## 2017-06-12 LAB — TISSUE TRANSGLUTAMINASE, IGA: (TTG) AB, IGA: 1 U/mL

## 2017-06-23 ENCOUNTER — Other Ambulatory Visit: Payer: BLUE CROSS/BLUE SHIELD

## 2017-06-23 DIAGNOSIS — R1084 Generalized abdominal pain: Secondary | ICD-10-CM

## 2017-06-23 DIAGNOSIS — R197 Diarrhea, unspecified: Secondary | ICD-10-CM

## 2017-06-23 DIAGNOSIS — K219 Gastro-esophageal reflux disease without esophagitis: Secondary | ICD-10-CM

## 2017-06-25 LAB — GASTROINTESTINAL PATHOGEN PANEL PCR
C. DIFFICILE TOX A/B, PCR: NOT DETECTED
CRYPTOSPORIDIUM, PCR: NOT DETECTED
Campylobacter, PCR: NOT DETECTED
E COLI (STEC) STX1/STX2, PCR: NOT DETECTED
E COLI 0157, PCR: NOT DETECTED
E coli (ETEC) LT/ST PCR: NOT DETECTED
Giardia lamblia, PCR: NOT DETECTED
Norovirus, PCR: NOT DETECTED
ROTAVIRUS, PCR: NOT DETECTED
SALMONELLA, PCR: NOT DETECTED
Shigella, PCR: NOT DETECTED

## 2017-07-22 ENCOUNTER — Encounter (INDEPENDENT_AMBULATORY_CARE_PROVIDER_SITE_OTHER): Payer: Self-pay

## 2017-07-22 ENCOUNTER — Encounter: Payer: Self-pay | Admitting: Gastroenterology

## 2017-07-22 ENCOUNTER — Ambulatory Visit: Payer: BLUE CROSS/BLUE SHIELD | Admitting: Gastroenterology

## 2017-07-22 VITALS — BP 100/58 | HR 72 | Ht <= 58 in | Wt 94.4 lb

## 2017-07-22 DIAGNOSIS — R197 Diarrhea, unspecified: Secondary | ICD-10-CM | POA: Diagnosis not present

## 2017-07-22 DIAGNOSIS — R1010 Upper abdominal pain, unspecified: Secondary | ICD-10-CM | POA: Diagnosis not present

## 2017-07-22 DIAGNOSIS — R1013 Epigastric pain: Secondary | ICD-10-CM | POA: Diagnosis not present

## 2017-07-22 MED ORDER — DICYCLOMINE HCL 10 MG PO CAPS: 10.0000 mg | ORAL_CAPSULE | Freq: Three times a day (TID) | ORAL | 11 refills | Status: DC

## 2017-07-22 NOTE — Progress Notes (Signed)
    History of Present Illness: This is an 84 year old female with mild upper abdominal pain and dyspepsia. Diarrhea improved. Post prandial upper abdominal pain persists. Pt is not a good historian. Daughter-in-law present and provides all translation. Difficult to get detailed information and descriptions possibly due to language barriers, cultural barriers, limited history provided. Symptoms present for many years and have not changed.  Current Medications, Allergies, Past Medical History, Past Surgical History, Family History and Social History were reviewed in [employer] record.  Physical Exam: General: Well developed, well nourished, elderly, thin, no acute distress Head: Normocephalic and atraumatic Eyes:  sclerae anicteric, EOMI Ears: Normal auditory acuity Mouth: No deformity or lesions Lungs: Clear throughout to auscultation Heart: Regular rate and rhythm; no murmurs, rubs or bruits Abdomen: Soft, mild epigastric and periumbilical tenderness and non distended. No masses, hepatosplenomegaly or hernias noted. Normal Bowel sounds Musculoskeletal: Symmetrical with no gross deformities  Pulses:  Normal pulses noted Extremities: No clubbing, cyanosis, edema or deformities noted Neurological: Alert oriented x 4, grossly nonfocal Psychological:  Alert and cooperative. Normal mood and affect  Assessment and Recommendations:  1 Upper abdominal pain, dyspepsia.  Chronic problem with extensive prior evaluations.  Etiology unclear, possible functional dyspepsia, IBS.  Continue omeprazole 20 mg daily for possible component of GERD.  Recommended abd/pelvic CT and patient declines.  Discontinue glycopyrrolate as the patient does not feel this improved her pain.  Begin dicyclomine 10 mg tid ac. Consider FDgard tid or IBgard tid if symptoms not improved. REV in 6 weeks.   2.  Diarrhea resolved after discontinuing magnesium and metoclopramide.  Patient advised to remain off  magnesium and metoclopramide. History of alternating diarrhea and constipation.    3.  Anemia with a normal ferritin, low transferrin and slightly low iron saturation.  Suspected chronic disease pattern.  Return to PCP for further evaluation.

## 2017-07-22 NOTE — Patient Instructions (Addendum)
Stop taking glycopyrrolate.  We have sent the following medications to your pharmacy for you to pick up at your convenience: dicyclomine.  ________________________________________________________________________  Normal BMI (Body Mass Index- based on height and weight) is between 23 and 30. Your BMI today is Body mass index is 20.43 kg/m. Marland Kitchen Please consider follow up  regarding your BMI with your Primary Care Provider.  Thank you for choosing me and [facility].  Pricilla Riffle. Dagoberto Ligas., MD., Marval Regal

## 2017-07-23 ENCOUNTER — Other Ambulatory Visit: Payer: Self-pay | Admitting: Physician Assistant

## 2017-07-23 DIAGNOSIS — G479 Sleep disorder, unspecified: Secondary | ICD-10-CM

## 2017-07-31 ENCOUNTER — Other Ambulatory Visit: Payer: BLUE CROSS/BLUE SHIELD

## 2017-08-09 ENCOUNTER — Encounter (HOSPITAL_COMMUNITY): Payer: Self-pay | Admitting: Emergency Medicine

## 2017-08-09 ENCOUNTER — Ambulatory Visit (HOSPITAL_COMMUNITY)
Admission: EM | Admit: 2017-08-09 | Discharge: 2017-08-09 | Disposition: A | Discharge status: Home / Self Care | Payer: BLUE CROSS/BLUE SHIELD | Attending: Family Medicine | Admitting: Family Medicine

## 2017-08-09 DIAGNOSIS — Z7184 Encounter for health counseling related to travel: Secondary | ICD-10-CM

## 2017-08-09 DIAGNOSIS — Z7189 Other specified counseling: Secondary | ICD-10-CM

## 2017-08-09 DIAGNOSIS — Z298 Encounter for other specified prophylactic measures: Secondary | ICD-10-CM | POA: Diagnosis not present

## 2017-08-09 MED ORDER — SCOPOLAMINE 1 MG/3DAYS TD PT72: 1.0000 | MEDICATED_PATCH | TRANSDERMAL | 12 refills | Status: DC

## 2017-08-09 NOTE — ED Provider Notes (Signed)
[place]   [id number] 08/09/17 Arrival Time: 1337   SUBJECTIVE:  Kendra Dickson is a 84 y.o. female who presents to the urgent care with complaint of needing prescription for transdermal protection against seasickness. Patient is going on a church related 3 hour ocean cruise tomorrow out of [place], [place]. Past Medical History:  Diagnosis Date  . Arthritis   . Asthma   . Hypothyroidism    Family History  Problem Relation Age of Onset  . Cancer Father        Gallbladder  . Pancreatic cancer Father   . Cancer Sister        Gallbladder   Social History   Socioeconomic History  . Marital status: Widowed    Spouse name: Not on file  . Number of children: 3  . Years of education: Not on file  . Highest education level: Not on file  Occupational History  . Not on file  Social Needs  . Financial resource strain: Not hard at all  . Food insecurity:    Worry: Never true    Inability: Never true  . Transportation needs:    Medical: No    Non-medical: No  Tobacco Use  . Smoking status: Never Smoker  . Smokeless tobacco: Never Used  Substance and Sexual Activity  . Alcohol use: No    Frequency: Never  . Drug use: No  . Sexual activity: Never  Lifestyle  . Physical activity:    Days per week: 0 days    Minutes per session: 0 min  . Stress: Not on file  Relationships  . Social connections:    Talks on phone: More than three times a week    Gets together: Once a week    Attends religious service: More than 4 times per year    Active member of club or organization: Yes    Attends meetings of clubs or organizations: More than 4 times per year    Relationship status: Widowed  . Intimate partner violence:    Fear of current or ex partner: No    Emotionally abused: No    Physically abused: No    Forced sexual activity: No  Other Topics Concern  . Not on file  Social History Narrative   She is from [place]. Moved here in 2017. Lives with son and  daughter in law. Has three children.    No outpatient medications have been marked as taking for the 08/09/17 encounter [facility] Encounter).   No Known Allergies    ROS: As per HPI, remainder of ROS negative.   OBJECTIVE:   Vitals:   08/09/17 1441  BP: (!) 102/55  Pulse: 75  Resp: 18  Temp: 98.1 F (36.7 C)  SpO2: 99%     General appearance: alert; no distress Eyes: PERRL; EOMI; conjunctiva normal HENT: normocephalic; atraumatic;  Neck: supple Extremities: no cyanosis or edema; symmetrical with no gross deformities Skin: warm and dry Neurologic: normal gait; grossly normal Psychological: alert and cooperative; normal mood and affect      Labs:  Results for orders placed or performed in visit on 06/23/17  Gastrointestinal Pathogen Panel PCR  Result Value Ref Range   Campylobacter, PCR NOT DETECTED NOT DETECT   C. difficile Tox A/B, PCR NOT DETECTED NOT DETECT   E coli 0157, PCR NOT DETECTED NOT DETECT   E coli (ETEC) LT/ST PCR NOT DETECTED NOT DETECT   E coli (STEC) stx1/stx2, PCR NOT DETECTED NOT DETECT  Salmonella, PCR NOT DETECTED NOT DETECT   Shigella, PCR NOT DETECTED NOT DETECT   Norovirus, PCR NOT DETECTED NOT DETECT   Rotavirus A, PCR NOT DETECTED NOT DETECT   Giardia lamblia, PCR NOT DETECTED NOT DETECT   Cryptosporidium, PCR NOT DETECTED NOT DETECT    Labs Reviewed - No data to display  No results found.     ASSESSMENT & PLAN:  1. Travel advice encounter     Meds ordered this encounter  Medications  . scopolamine (TRANSDERM-SCOP, 1.5 MG,) 1 MG/3DAYS    Sig: Place 1 patch (1.5 mg total) onto the skin every 3 (three) days.    Dispense:  3 patch    Refill:  12    Reviewed expectations re: course of current medical issues. Questions answered. Outlined signs and symptoms indicating need for more acute intervention. Patient verbalized understanding. After Visit Summary given.    Procedures:      Robyn Haber, MD 08/09/17  [phone]

## 2017-08-09 NOTE — ED Triage Notes (Signed)
Per Family, pt is going on a boat and needs medication for motion sickness

## 2017-10-24 ENCOUNTER — Other Ambulatory Visit: Payer: Self-pay | Admitting: Physician Assistant

## 2017-10-24 DIAGNOSIS — E039 Hypothyroidism, unspecified: Secondary | ICD-10-CM

## 2017-10-25 ENCOUNTER — Other Ambulatory Visit: Payer: Self-pay | Admitting: Physician Assistant

## 2017-10-25 DIAGNOSIS — G479 Sleep disorder, unspecified: Secondary | ICD-10-CM

## 2017-10-26 NOTE — Telephone Encounter (Signed)
Trazodone refill Last Refill:07/23/17 # 90/1 refill Last OV: 04/29/17  PCP: [place]: [id number] [place], [place] [phone] (Phone) [phone] (Fax)

## 2017-11-02 ENCOUNTER — Ambulatory Visit: Payer: BLUE CROSS/BLUE SHIELD | Admitting: Physician Assistant

## 2017-11-09 ENCOUNTER — Other Ambulatory Visit: Payer: Self-pay

## 2017-11-09 ENCOUNTER — Ambulatory Visit: Payer: BLUE CROSS/BLUE SHIELD | Admitting: Physician Assistant

## 2017-11-09 ENCOUNTER — Encounter: Payer: Self-pay | Admitting: Physician Assistant

## 2017-11-09 VITALS — BP 100/60 | HR 72 | Temp 98.2°F | Resp 18 | Ht <= 58 in | Wt 87.8 lb

## 2017-11-09 DIAGNOSIS — E039 Hypothyroidism, unspecified: Secondary | ICD-10-CM

## 2017-11-09 DIAGNOSIS — G479 Sleep disorder, unspecified: Secondary | ICD-10-CM | POA: Diagnosis not present

## 2017-11-09 DIAGNOSIS — D649 Anemia, unspecified: Secondary | ICD-10-CM | POA: Diagnosis not present

## 2017-11-09 MED ORDER — LEVOTHYROXINE SODIUM 50 MCG PO TABS: 50.0000 ug | ORAL_TABLET | Freq: Every day | ORAL | 1 refills | Status: DC

## 2017-11-09 MED ORDER — TRAZODONE HCL 50 MG PO TABS: ORAL_TABLET | ORAL | 0 refills | Status: DC

## 2017-11-09 NOTE — Progress Notes (Signed)
Kendra Dickson  MRN: [id number] DOB: [birth year]  Subjective:  Kendra Dickson is a 84 y.o. female seen in office today for a chief complaint of f/u for hypothyroidism, sleep disturbance, and lab work. Pt is accompanied by daughter in law who acts as Optometrist.   -Hypothyroidism: Controlled on synthroid 63mcg. Has not been out of medication. No current symptoms.   -Sleep issues: Takes trazodone 50mg  at night. This helps a lot. Feels rested thoughout the day.  -Lab work from 04/2017 showed normocytic anemia. Pt has not followed up in office since but has seen GI, who suspects it is chronic in nature. Per pt, she thinks she has had this issue forever. Remembers getting blood transfusions but cannot remember how many or if she saw a hematologist. Denies lightheadedness, dizziness, fatigue, palpitations, SOB, vaginal bleeding, hematochezia, melena, fever, chills, loss of appetite, and weight loss.  Last colonoscopy was 2017, last mammogram was 2009, last pap was "years ago" and she thinks it was normal. No PMH of kidney disease.   Review of Systems  Per HPI  There are no active problems to display for this patient.   Current Outpatient Medications on File Prior to Visit  Medication Sig Dispense Refill  . Alpha-D-Galactosidase (BEANO PO) Take by mouth daily as needed.    Marland Kitchen aspirin 81 MG chewable tablet Chew by mouth daily.    . Cholecalciferol (VITAMIN D3) 3000 units TABS Take by mouth daily.    Marland Kitchen dicyclomine (BENTYL) 10 MG capsule Take 1 capsule (10 mg total) by mouth 3 (three) times daily before meals. 90 capsule 11  . Lutein-Zeaxanthin 25-5 MG CAPS Take by mouth.    . magnesium oxide (MAG-OX) 400 MG tablet Take 400 mg by mouth daily.    . Multiple Vitamins-Minerals (CENTRUM SILVER PO) Take by mouth daily.    Marland Kitchen omeprazole (PRILOSEC) 20 MG capsule Take 1 capsule (20 mg total) by mouth daily. 30 capsule 11  . scopolamine (TRANSDERM-SCOP, 1.5 MG,) 1 MG/3DAYS Place 1 patch (1.5 mg total) onto the  skin every 3 (three) days. 3 patch 12  . DENTA 5000 PLUS 1.1 % CREA dental cream USE DAY & NIGHT,BRUSH THOROUGHLY FOR 2 MINS & SPIT OUT. DON'T SWALLOW & RINSE LIGHTLY  1  . glycopyrrolate (ROBINUL) 1 MG tablet Take 1 mg by mouth 2 (two) times daily.  11   No current facility-administered medications on file prior to visit.     No Known Allergies    Social History   Socioeconomic History  . Marital status: Widowed    Spouse name: Not on file  . Number of children: 3  . Years of education: Not on file  . Highest education level: Not on file  Occupational History  . Not on file  Social Needs  . Financial resource strain: Not hard at all  . Food insecurity:    Worry: Never true    Inability: Never true  . Transportation needs:    Medical: No    Non-medical: No  Tobacco Use  . Smoking status: Never Smoker  . Smokeless tobacco: Never Used  Substance and Sexual Activity  . Alcohol use: No    Frequency: Never  . Drug use: No  . Sexual activity: Never  Lifestyle  . Physical activity:    Days per week: 0 days    Minutes per session: 0 min  . Stress: Not on file  Relationships  . Social connections:    Talks on phone: More than three  times a week    Gets together: Once a week    Attends religious service: More than 4 times per year    Active member of club or organization: Yes    Attends meetings of clubs or organizations: More than 4 times per year    Relationship status: Widowed  . Intimate partner violence:    Fear of current or ex partner: No    Emotionally abused: No    Physically abused: No    Forced sexual activity: No  Other Topics Concern  . Not on file  Social History Narrative   She is from [place]. Moved here in 2017. Lives with son and daughter in law. Has three children.     Objective:  BP 100/60   Pulse 72   Temp 98.2 F (36.8 C) (Oral)   Resp 18   Ht 4' 9.4" (1.458 m)   Wt 87 lb 12.8 oz (39.8 kg)   SpO2 99%   BMI 18.73 kg/m   Physical Exam    Constitutional: She is oriented to person, place, and time. She appears well-developed and well-nourished.  Elderly frail female.   HENT:  Head: Normocephalic and atraumatic.  Eyes: Conjunctivae are normal.  Neck: Normal range of motion.  Cardiovascular: Normal rate, regular rhythm and normal heart sounds.  Pulmonary/Chest: Effort normal and breath sounds normal. She has no wheezes. She has no rhonchi. She has no rales.  Neurological: She is alert and oriented to person, place, and time.  Skin: Skin is warm and dry.  Psychiatric: She has a normal mood and affect.  Vitals reviewed.   Assessment and Plan :  1. Hypothyroidism, unspecified type Labs pending. Refills provided. F/u in 6 months.  - TSH - levothyroxine (SYNTHROID, LEVOTHROID) 50 MCG tablet; Take 1 tablet (50 mcg total) by mouth daily before breakfast.  Dispense: 90 tablet; Refill: 1  2. Sleep disturbance Controlled. Refills provided. - traZODone (DESYREL) 50 MG tablet; TAKE 1 TABLET BY MOUTH EVERYDAY AT BEDTIME  Dispense: 90 tablet; Refill: 0  3. Normocytic anemia Unclear etiology at this time. She is asx. Labs pending. Will likely need referral to hematology for further evaluation.  - C-reactive protein - Sedimentation Rate - CBC with Differential/Platelet - Hemoglobinopathy evaluation - Lactate Dehydrogenase - Haptoglobin - Reticulocytes - Pathologist smear review - Iron, TIBC and Ferritin Panel   Tenna Delaine PA-C  [facility] 11/09/2017 12:52 PM

## 2017-11-09 NOTE — Patient Instructions (Signed)
We have collected labs today and will contact you within next week with results. Thank you for letting me participate in your health and well being.   Anemia Anemia is a condition in which you do not have enough red blood cells or hemoglobin. Hemoglobin is a substance in red blood cells that carries oxygen. When you do not have enough red blood cells or hemoglobin (are anemic), your body cannot get enough oxygen and your organs may not work properly. As a result, you may feel very tired or have other problems. What are the causes? Common causes of anemia include:  Excessive bleeding. Anemia can be caused by excessive bleeding inside or outside the body, including bleeding from the intestine or from periods in women.  Poor nutrition.  Long-lasting (chronic) kidney, thyroid, and liver disease.  Bone marrow disorders.  Cancer and treatments for cancer.  HIV (human immunodeficiency virus) and AIDS (acquired immunodeficiency syndrome).  Treatments for HIV and AIDS.  Spleen problems.  Blood disorders.  Infections, medicines, and autoimmune disorders that destroy red blood cells.  What are the signs or symptoms? Symptoms of this condition include:  Minor weakness.  Dizziness.  Headache.  Feeling heartbeats that are irregular or faster than normal (palpitations).  Shortness of breath, especially with exercise.  Paleness.  Cold sensitivity.  Indigestion.  Nausea.  Difficulty sleeping.  Difficulty concentrating.  Symptoms may occur suddenly or develop slowly. If your anemia is mild, you may not have symptoms. How is this diagnosed? This condition is diagnosed based on:  Blood tests.  Your medical history.  A physical exam.  Bone marrow biopsy.  Your health care provider may also check your stool (feces) for blood and may do additional testing to look for the cause of your bleeding. You may also have other tests, including:  Imaging tests, such as a CT scan or  MRI.  Endoscopy.  Colonoscopy.  How is this treated? Treatment for this condition depends on the cause. If you continue to lose a lot of blood, you may need to be treated at a hospital. Treatment may include:  Taking supplements of iron, vitamin A44, or folic acid.  Taking a hormone medicine (erythropoietin) that can help to stimulate red blood cell growth.  Having a blood transfusion. This may be needed if you lose a lot of blood.  Making changes to your diet.  Having surgery to remove your spleen.  Follow these instructions at home:  Take over-the-counter and prescription medicines only as told by your health care provider.  Take supplements only as told by your health care provider.  Follow any diet instructions that you were given.  Keep all follow-up visits as told by your health care provider. This is important. Contact a health care provider if:  You develop new bleeding anywhere in the body. Get help right away if:  You are very weak.  You are short of breath.  You have pain in your abdomen or chest.  You are dizzy or feel faint.  You have trouble concentrating.  You have bloody or black, tarry stools.  You vomit repeatedly or you vomit up blood. Summary  Anemia is a condition in which you do not have enough red blood cells or enough of a substance in your red blood cells that carries oxygen (hemoglobin).  Symptoms may occur suddenly or develop slowly.  If your anemia is mild, you may not have symptoms.  This condition is diagnosed with blood tests as well as a medical history  and physical exam. Other tests may be needed.  Treatment for this condition depends on the cause of the anemia. This information is not intended to replace advice given to you by your health care provider. Make sure you discuss any questions you have with your health care provider. Document Released: 04/10/2004 Document Revised: 04/04/2016 Document Reviewed: 04/04/2016 Elsevier  Interactive Patient Education  [employer].

## 2017-11-11 LAB — CBC WITH DIFFERENTIAL/PLATELET
BASOS: 0 %
Basophils Absolute: 0 10*3/uL (ref 0.0–0.2)
EOS (ABSOLUTE): 0 10*3/uL (ref 0.0–0.4)
Eos: 0 %
Hematocrit: 30.6 % — ABNORMAL LOW (ref 34.0–46.6)
Hemoglobin: 10.3 g/dL — ABNORMAL LOW (ref 11.1–15.9)
IMMATURE GRANULOCYTES: 0 %
Immature Grans (Abs): 0 10*3/uL (ref 0.0–0.1)
Lymphocytes Absolute: 1.4 10*3/uL (ref 0.7–3.1)
Lymphs: 31 %
MCH: 30.6 pg (ref 26.6–33.0)
MCHC: 33.7 g/dL (ref 31.5–35.7)
MCV: 91 fL (ref 79–97)
MONOS ABS: 0.4 10*3/uL (ref 0.1–0.9)
Monocytes: 9 %
NEUTROS PCT: 60 %
Neutrophils Absolute: 2.6 10*3/uL (ref 1.4–7.0)
PLATELETS: 245 10*3/uL (ref 150–450)
RBC: 3.37 x10E6/uL — ABNORMAL LOW (ref 3.77–5.28)
RDW: 12.2 % — AB (ref 12.3–15.4)
WBC: 4.5 10*3/uL (ref 3.4–10.8)

## 2017-11-11 LAB — IRON,TIBC AND FERRITIN PANEL
Ferritin: 121 ng/mL (ref 15–150)
Iron Saturation: 41 % (ref 15–55)
Iron: 97 ug/dL (ref 27–139)
Total Iron Binding Capacity: 239 ug/dL — ABNORMAL LOW (ref 250–450)
UIBC: 142 ug/dL (ref 118–369)

## 2017-11-11 LAB — RETICULOCYTES: RETIC CT PCT: 0.9 % (ref 0.6–2.6)

## 2017-11-11 LAB — TSH: TSH: 1.05 u[IU]/mL (ref 0.450–4.500)

## 2017-11-11 LAB — HEMOGLOBINOPATHY EVALUATION
HGB C: 0 %
HGB S: 0 %
HGB VARIANT: 0 %
Hemoglobin A2 Quantitation: 2.3 % (ref 1.8–3.2)
Hemoglobin F Quantitation: 0 % (ref 0.0–2.0)
Hgb A: 97.7 % (ref 96.4–98.8)

## 2017-11-11 LAB — SEDIMENTATION RATE: SED RATE: 5 mm/h (ref 0–40)

## 2017-11-11 LAB — HAPTOGLOBIN: HAPTOGLOBIN: 124 mg/dL (ref 34–200)

## 2017-11-11 LAB — LACTATE DEHYDROGENASE: LDH: 166 IU/L (ref 119–226)

## 2017-11-11 LAB — C-REACTIVE PROTEIN: CRP: <1 mg/L (ref 0–10)

## 2017-11-13 LAB — PATHOLOGIST SMEAR REVIEW
BASOS ABS: 0 10*3/uL (ref 0.0–0.2)
Basos: 0 %
EOS (ABSOLUTE): 0 10*3/uL (ref 0.0–0.4)
EOS: 1 %
HEMOGLOBIN: 10.6 g/dL — AB (ref 11.1–15.9)
Hematocrit: 32.8 % — ABNORMAL LOW (ref 34.0–46.6)
Immature Grans (Abs): 0 10*3/uL (ref 0.0–0.1)
Immature Granulocytes: 0 %
LYMPHS ABS: 1.5 10*3/uL (ref 0.7–3.1)
Lymphs: 34 %
MCH: 30.4 pg (ref 26.6–33.0)
MCHC: 32.3 g/dL (ref 31.5–35.7)
MCV: 94 fL (ref 79–97)
Monocytes Absolute: 0.4 10*3/uL (ref 0.1–0.9)
Monocytes: 9 %
Neutrophils Absolute: 2.5 10*3/uL (ref 1.4–7.0)
Neutrophils: 56 %
PATH REV PLTS: NORMAL
PATH REV WBC: NORMAL
PLATELETS: 258 10*3/uL (ref 150–450)
RBC: 3.49 x10E6/uL — AB (ref 3.77–5.28)
RDW: 13.5 % (ref 12.3–15.4)
WBC: 4.5 10*3/uL (ref 3.4–10.8)

## 2017-12-22 ENCOUNTER — Encounter: Payer: Self-pay | Admitting: *Deleted

## 2018-02-05 ENCOUNTER — Telehealth: Payer: Self-pay | Admitting: Physician Assistant

## 2018-02-05 NOTE — Telephone Encounter (Signed)
Spoke with the pt's daughter In law (per DPR) to try and reschedule appt in February with Timmothy Euler, Daughter in law stated that [place] told them they can only go to [facility] practices so they do not need the appt. She asked if we could snd records and I advised that we would have to have a Medical Release signed by the pt. Pt acknowledged.

## 2018-04-04 ENCOUNTER — Other Ambulatory Visit: Payer: Self-pay | Admitting: Gastroenterology

## 2018-05-12 ENCOUNTER — Ambulatory Visit: Payer: BLUE CROSS/BLUE SHIELD | Admitting: Physician Assistant

## 2018-05-14 ENCOUNTER — Other Ambulatory Visit: Payer: Self-pay | Admitting: Physician Assistant

## 2018-05-14 DIAGNOSIS — G479 Sleep disorder, unspecified: Secondary | ICD-10-CM

## 2018-05-14 NOTE — Telephone Encounter (Signed)
Requested medication (s) are due for refill today: yes  Requested medication (s) are on the active medication list: yes  Last refill:  11/09/17 by Adolm Joseph  Future visit scheduled: no  Notes to clinic:  Left VM to call office for appointment    Requested Prescriptions  Pending Prescriptions Disp Refills   traZODone (DESYREL) 50 MG tablet [Pharmacy Med Name: TRAZODONE 50 MG TABLET] 90 tablet 0    Sig: TAKE 1 TABLET BY MOUTH EVERYDAY AT BEDTIME     Psychiatry: Antidepressants - Serotonin Modulator Failed - 05/14/2018  2:07 PM      Failed - Valid encounter within last 6 months    Recent Outpatient Visits          6 months ago Hypothyroidism, unspecified type   [facility] [place], [place] D, PA-C   1 year ago Annual physical exam   [facility] at [place], [place] D, PA-C   1 year ago Gastroesophageal reflux disease, esophagitis presence not specified   [facility] at [place], [place] D, [place]

## 2018-05-14 NOTE — Telephone Encounter (Signed)
Call placed to patient via interpreter # [phone]. Message to return call to office to schedule appointment.

## 2018-06-06 ENCOUNTER — Other Ambulatory Visit: Payer: Self-pay | Admitting: Physician Assistant

## 2018-06-06 DIAGNOSIS — E039 Hypothyroidism, unspecified: Secondary | ICD-10-CM

## 2018-06-22 ENCOUNTER — Other Ambulatory Visit: Payer: Self-pay | Admitting: Physician Assistant

## 2018-06-22 DIAGNOSIS — E039 Hypothyroidism, unspecified: Secondary | ICD-10-CM

## 2018-06-22 NOTE — Telephone Encounter (Signed)
Requested medication (s) are due for refill today: yes  Requested medication (s) are on the active medication list: yes  Last refill:  03/13/18  Future visit scheduled: no  Notes to clinic:  Last office visit 11/09/17 with Tenna Delaine    Requested Prescriptions  Pending Prescriptions Disp Refills   levothyroxine (SYNTHROID, LEVOTHROID) 50 MCG tablet [Pharmacy Med Name: LEVOTHYROXINE 50 MCG TABLET] 90 tablet 1    Sig: TAKE 1 TABLET BY MOUTH DAILY BEFORE BREAKFAST     Endocrinology:  Hypothyroid Agents Failed - 06/22/2018  1:21 PM      Failed - TSH needs to be rechecked within 3 months after an abnormal result. Refill until TSH is due.      Passed - TSH in normal range and within 360 days    TSH  Date Value Ref Range Status  11/09/2017 1.050 0.450 - 4.500 uIU/mL Final         Passed - Valid encounter within last 12 months    Recent Outpatient Visits          7 months ago Hypothyroidism, unspecified type   [facility] at [facility], [place] D, PA-C   1 year ago Annual physical exam   [facility] at [place], [place] D, PA-C   1 year ago Gastroesophageal reflux disease, esophagitis presence not specified   [facility] Care at [facility], [place] D, [place]

## 2018-06-25 ENCOUNTER — Telehealth: Payer: Self-pay | Admitting: Family Medicine

## 2018-06-25 DIAGNOSIS — E039 Hypothyroidism, unspecified: Secondary | ICD-10-CM

## 2018-06-25 DIAGNOSIS — G479 Sleep disorder, unspecified: Secondary | ICD-10-CM

## 2018-06-25 MED ORDER — LEVOTHYROXINE SODIUM 50 MCG PO TABS: 50.0000 ug | ORAL_TABLET | Freq: Every day | ORAL | 0 refills | Status: DC

## 2018-06-25 MED ORDER — TRAZODONE HCL 50 MG PO TABS: ORAL_TABLET | ORAL | 0 refills | Status: DC

## 2018-06-25 NOTE — Telephone Encounter (Signed)
I will refill temporarily without office visit, but see previous phone note in November, as it appears that she may need to be followed by [facility] practice.  If she has not set up an appointment with them, should schedule as soon as possible.   02/05/18 phone note: "Daughter in law stated that [place] told them they can only go to [facility] practices so they do not need the appt. She asked if we could snd records and I advised that we would have to have a Medical Release signed by the pt. Pt acknowledged"

## 2018-06-25 NOTE — Telephone Encounter (Signed)
Please advise as she has been out of meds for a couple months and Timmothy Euler was the one to prescribe them but due to her age I wasn't sure if you could give her some until she can be seen.

## 2018-06-25 NOTE — Telephone Encounter (Signed)
Copied from [place] [id number]. Topic: Quick Communication - Rx Refill/Question >> Jun 25, 2018 10:47 AM Selinda Flavin B, NT wrote: Medication: levothyroxine (SYNTHROID, LEVOTHROID) 50 MCG tablet  traZODone (DESYREL) 50 MG tablet  Has the patient contacted their pharmacy? yes (Agent: If no, request that the patient contact the pharmacy for the refill.) (Agent: If yes, when and what did the pharmacy advise?)  Preferred Pharmacy (with phone number or street name): [id number] [place], [place]: Please be advised that RX refills may take up to 3 business days. We ask that you follow-up with your pharmacy.

## 2018-06-29 ENCOUNTER — Other Ambulatory Visit: Payer: Self-pay | Admitting: Gastroenterology

## 2018-07-17 ENCOUNTER — Other Ambulatory Visit: Payer: Self-pay | Admitting: Gastroenterology

## 2018-09-17 ENCOUNTER — Other Ambulatory Visit: Payer: Self-pay | Admitting: Family Medicine

## 2018-09-17 DIAGNOSIS — E039 Hypothyroidism, unspecified: Secondary | ICD-10-CM

## 2018-09-17 DIAGNOSIS — G479 Sleep disorder, unspecified: Secondary | ICD-10-CM

## 2018-09-17 NOTE — Telephone Encounter (Signed)
Forwarding medication refills to PCP for review. 

## 2018-09-17 NOTE — Telephone Encounter (Signed)
Refill given, but is overdue for office visit and labs. Please schedule appointment.

## 2018-10-05 ENCOUNTER — Telehealth: Payer: Self-pay | Admitting: Gastroenterology

## 2018-10-05 MED ORDER — OMEPRAZOLE 20 MG PO CPDR: DELAYED_RELEASE_CAPSULE | ORAL | 11 refills | Status: DC

## 2018-10-05 NOTE — Telephone Encounter (Signed)
Prescription sent to patient's pharmacy and notified to schedule appt for further refills

## 2018-10-08 ENCOUNTER — Telehealth: Payer: Self-pay | Admitting: Gastroenterology

## 2018-10-08 ENCOUNTER — Telehealth: Payer: Self-pay | Admitting: Family Medicine

## 2018-10-08 MED ORDER — GLYCOPYRROLATE 1 MG PO TABS: 1.0000 mg | ORAL_TABLET | Freq: Two times a day (BID) | ORAL | 0 refills | Status: DC

## 2018-10-08 NOTE — Telephone Encounter (Signed)
Attempted to call number back provided and  It states the call not be completed as dialed. Prescription sent for one more month.

## 2018-10-08 NOTE — Telephone Encounter (Signed)
Pt's daughter called requesting rf for glycopyrrolate, I told her that pt needs an ov before but she stated that pt recently changed insurance carrier and now Dr. Fuller Plan is not in Network so she is requesting a last refill.

## 2018-10-08 NOTE — Telephone Encounter (Signed)
Medication Refill - Medication:  levothyroxine (SYNTHROID) 50 MCG tablet  Has the patient contacted their pharmacy? Yes advised to call office.   Preferred Pharmacy (with phone number or street name):  [id number] [place], [place] [phone] (Phone) [phone] (Fax)   Agent: Please be advised that RX refills may take up to 3 business days. We ask that you follow-up with your pharmacy.

## 2018-10-10 NOTE — Telephone Encounter (Signed)
A 90 day supply was refilled on 09/17/2018 by Dr. Carlota Raspberry

## 2020-08-27 ENCOUNTER — Observation Stay (HOSPITAL_COMMUNITY): Payer: BLUE CROSS/BLUE SHIELD | Admitting: Certified Registered"

## 2020-08-27 ENCOUNTER — Encounter (HOSPITAL_COMMUNITY): Payer: Self-pay | Admitting: Radiology

## 2020-08-27 ENCOUNTER — Inpatient Hospital Stay (HOSPITAL_COMMUNITY)
Admission: EM | Admit: 2020-08-27 | Discharge: 2020-08-31 | DRG: 374 | Disposition: A | Discharge status: Home / Self Care | Payer: BLUE CROSS/BLUE SHIELD | Attending: Internal Medicine | Admitting: Internal Medicine

## 2020-08-27 ENCOUNTER — Emergency Department (HOSPITAL_COMMUNITY): Payer: BLUE CROSS/BLUE SHIELD

## 2020-08-27 ENCOUNTER — Other Ambulatory Visit: Payer: Self-pay

## 2020-08-27 ENCOUNTER — Encounter (HOSPITAL_COMMUNITY)
Admission: EM | Disposition: A | Discharge status: Home / Self Care | Payer: Self-pay | Source: Home / Self Care | Attending: Internal Medicine

## 2020-08-27 DIAGNOSIS — Z79890 Hormone replacement therapy (postmenopausal): Secondary | ICD-10-CM

## 2020-08-27 DIAGNOSIS — C170 Malignant neoplasm of duodenum: Secondary | ICD-10-CM | POA: Diagnosis not present

## 2020-08-27 DIAGNOSIS — E43 Unspecified severe protein-calorie malnutrition: Secondary | ICD-10-CM | POA: Diagnosis present

## 2020-08-27 DIAGNOSIS — I21A1 Myocardial infarction type 2: Secondary | ICD-10-CM | POA: Diagnosis present

## 2020-08-27 DIAGNOSIS — G8929 Other chronic pain: Secondary | ICD-10-CM | POA: Diagnosis present

## 2020-08-27 DIAGNOSIS — Z20822 Contact with and (suspected) exposure to covid-19: Secondary | ICD-10-CM | POA: Diagnosis present

## 2020-08-27 DIAGNOSIS — R778 Other specified abnormalities of plasma proteins: Secondary | ICD-10-CM

## 2020-08-27 DIAGNOSIS — M1990 Unspecified osteoarthritis, unspecified site: Secondary | ICD-10-CM | POA: Diagnosis present

## 2020-08-27 DIAGNOSIS — G479 Sleep disorder, unspecified: Secondary | ICD-10-CM

## 2020-08-27 DIAGNOSIS — Z66 Do not resuscitate: Secondary | ICD-10-CM | POA: Diagnosis not present

## 2020-08-27 DIAGNOSIS — K922 Gastrointestinal hemorrhage, unspecified: Secondary | ICD-10-CM | POA: Diagnosis present

## 2020-08-27 DIAGNOSIS — J45909 Unspecified asthma, uncomplicated: Secondary | ICD-10-CM | POA: Diagnosis present

## 2020-08-27 DIAGNOSIS — D649 Anemia, unspecified: Secondary | ICD-10-CM | POA: Diagnosis present

## 2020-08-27 DIAGNOSIS — R64 Cachexia: Secondary | ICD-10-CM | POA: Diagnosis present

## 2020-08-27 DIAGNOSIS — E039 Hypothyroidism, unspecified: Secondary | ICD-10-CM | POA: Diagnosis present

## 2020-08-27 DIAGNOSIS — Z801 Family history of malignant neoplasm of trachea, bronchus and lung: Secondary | ICD-10-CM

## 2020-08-27 DIAGNOSIS — K219 Gastro-esophageal reflux disease without esophagitis: Secondary | ICD-10-CM | POA: Diagnosis present

## 2020-08-27 DIAGNOSIS — D696 Thrombocytopenia, unspecified: Secondary | ICD-10-CM | POA: Diagnosis present

## 2020-08-27 DIAGNOSIS — R54 Age-related physical debility: Secondary | ICD-10-CM | POA: Diagnosis present

## 2020-08-27 DIAGNOSIS — D62 Acute posthemorrhagic anemia: Secondary | ICD-10-CM | POA: Diagnosis present

## 2020-08-27 DIAGNOSIS — I959 Hypotension, unspecified: Secondary | ICD-10-CM | POA: Diagnosis not present

## 2020-08-27 DIAGNOSIS — E875 Hyperkalemia: Secondary | ICD-10-CM | POA: Diagnosis present

## 2020-08-27 DIAGNOSIS — Z79899 Other long term (current) drug therapy: Secondary | ICD-10-CM

## 2020-08-27 DIAGNOSIS — E739 Lactose intolerance, unspecified: Secondary | ICD-10-CM | POA: Diagnosis present

## 2020-08-27 DIAGNOSIS — Z800 Family history of malignant neoplasm of digestive organs: Secondary | ICD-10-CM

## 2020-08-27 DIAGNOSIS — R1013 Epigastric pain: Secondary | ICD-10-CM | POA: Diagnosis not present

## 2020-08-27 DIAGNOSIS — Z7982 Long term (current) use of aspirin: Secondary | ICD-10-CM

## 2020-08-27 DIAGNOSIS — Z681 Body mass index (BMI) 19 or less, adult: Secondary | ICD-10-CM

## 2020-08-27 HISTORY — PX: ESOPHAGOGASTRODUODENOSCOPY (EGD) WITH PROPOFOL: SHX5813

## 2020-08-27 HISTORY — PX: BIOPSY: SHX5522

## 2020-08-27 LAB — HEMOGLOBIN AND HEMATOCRIT, BLOOD
HCT: 28.6 % — ABNORMAL LOW (ref 36.0–46.0)
Hemoglobin: 9.3 g/dL — ABNORMAL LOW (ref 12.0–15.0)

## 2020-08-27 LAB — CBC
HCT: 23.3 % — ABNORMAL LOW (ref 36.0–46.0)
Hemoglobin: 7.5 g/dL — ABNORMAL LOW (ref 12.0–15.0)
MCH: 31.3 pg (ref 26.0–34.0)
MCHC: 32.2 g/dL (ref 30.0–36.0)
MCV: 97.1 fL (ref 80.0–100.0)
Platelets: 221 10*3/uL (ref 150–400)
RBC: 2.4 MIL/uL — ABNORMAL LOW (ref 3.87–5.11)
RDW: 13 % (ref 11.5–15.5)
WBC: 9.1 10*3/uL (ref 4.0–10.5)
nRBC: 0 % (ref 0.0–0.2)

## 2020-08-27 LAB — COMPREHENSIVE METABOLIC PANEL
ALT: 10 U/L (ref 0–44)
AST: 16 U/L (ref 15–41)
Albumin: 2.9 g/dL — ABNORMAL LOW (ref 3.5–5.0)
Alkaline Phosphatase: 41 U/L (ref 38–126)
Anion gap: 6 (ref 5–15)
BUN: 46 mg/dL — ABNORMAL HIGH (ref 8–23)
CO2: 25 mmol/L (ref 22–32)
Calcium: 8.4 mg/dL — ABNORMAL LOW (ref 8.9–10.3)
Chloride: 108 mmol/L (ref 98–111)
Creatinine, Ser: 0.59 mg/dL (ref 0.44–1.00)
GFR, Estimated: >60 mL/min (ref >60)
Glucose, Bld: 171 mg/dL — ABNORMAL HIGH (ref 70–99)
Potassium: 5.2 mmol/L — ABNORMAL HIGH (ref 3.5–5.1)
Sodium: 139 mmol/L (ref 135–145)
Total Bilirubin: 0.4 mg/dL (ref 0.3–1.2)
Total Protein: 5.4 g/dL — ABNORMAL LOW (ref 6.5–8.1)

## 2020-08-27 LAB — URINALYSIS, ROUTINE W REFLEX MICROSCOPIC
Bilirubin Urine: NEGATIVE
Glucose, UA: NEGATIVE mg/dL
Ketones, ur: NEGATIVE mg/dL
Leukocytes,Ua: NEGATIVE
Nitrite: NEGATIVE
Protein, ur: NEGATIVE mg/dL
Specific Gravity, Urine: 1.023 (ref 1.005–1.030)
pH: 7 (ref 5.0–8.0)

## 2020-08-27 LAB — ABO/RH: ABO/RH(D): A POS

## 2020-08-27 LAB — TROPONIN I (HIGH SENSITIVITY)
Troponin I (High Sensitivity): 37 ng/L — ABNORMAL HIGH (ref <18)
Troponin I (High Sensitivity): 65 ng/L — ABNORMAL HIGH (ref <18)

## 2020-08-27 LAB — LIPASE, BLOOD: Lipase: 50 U/L (ref 11–51)

## 2020-08-27 LAB — PREPARE RBC (CROSSMATCH)

## 2020-08-27 LAB — RESP PANEL BY RT-PCR (FLU A&B, COVID) ARPGX2
Influenza A by PCR: NEGATIVE
Influenza B by PCR: NEGATIVE
SARS Coronavirus 2 by RT PCR: NEGATIVE

## 2020-08-27 SURGERY — ESOPHAGOGASTRODUODENOSCOPY (EGD) WITH PROPOFOL
Anesthesia: Monitor Anesthesia Care

## 2020-08-27 MED ORDER — IOHEXOL 300 MG/ML  SOLN
75.0000 mL | Freq: Once | INTRAMUSCULAR | Status: AC | PRN
  Administered 2020-08-27: 75 mL via INTRAVENOUS

## 2020-08-27 MED ORDER — MORPHINE SULFATE (PF) 2 MG/ML IV SOLN
2.0000 mg | INTRAVENOUS | Status: DC | PRN
  Administered 2020-08-27: 2 mg via INTRAVENOUS
  Filled 2020-08-27: qty 1

## 2020-08-27 MED ORDER — LIDOCAINE HCL (CARDIAC) PF 100 MG/5ML IV SOSY
PREFILLED_SYRINGE | INTRAVENOUS | Status: DC | PRN
  Administered 2020-08-27: 50 mg via INTRAVENOUS

## 2020-08-27 MED ORDER — PANTOPRAZOLE SODIUM 40 MG IV SOLR: 40.0000 mg | Freq: Two times a day (BID) | INTRAVENOUS | Status: DC

## 2020-08-27 MED ORDER — SODIUM CHLORIDE 0.9 % IV SOLN
10.0000 mL/h | Freq: Once | INTRAVENOUS | Status: AC
  Administered 2020-08-27: 10 mL/h via INTRAVENOUS

## 2020-08-27 MED ORDER — LACTATED RINGERS IV BOLUS
1000.0000 mL | Freq: Once | INTRAVENOUS | Status: AC
  Administered 2020-08-27: 1000 mL via INTRAVENOUS

## 2020-08-27 MED ORDER — PANTOPRAZOLE 80MG IVPB - SIMPLE MED
80.0000 mg | Freq: Once | INTRAVENOUS | Status: AC
  Administered 2020-08-27: 80 mg via INTRAVENOUS
  Filled 2020-08-27: qty 80

## 2020-08-27 MED ORDER — SODIUM CHLORIDE 0.9 % IV SOLN
8.0000 mg/h | INTRAVENOUS | Status: AC
  Administered 2020-08-27 – 2020-08-30 (×7): 8 mg/h via INTRAVENOUS
  Filled 2020-08-27 (×10): qty 80

## 2020-08-27 MED ORDER — SODIUM CHLORIDE 0.9 % IV SOLN: INTRAVENOUS | Status: DC

## 2020-08-27 MED ORDER — IOHEXOL 9 MG/ML PO SOLN
500.0000 mL | ORAL | Status: AC
  Administered 2020-08-27 (×2): 500 mL via ORAL

## 2020-08-27 MED ORDER — PANTOPRAZOLE 80MG IVPB - SIMPLE MED
80.0000 mg | Freq: Once | INTRAVENOUS | Status: DC
  Filled 2020-08-27: qty 100

## 2020-08-27 MED ORDER — IOHEXOL 9 MG/ML PO SOLN
ORAL | Status: AC
  Filled 2020-08-27: qty 1000

## 2020-08-27 MED ORDER — PROPOFOL 500 MG/50ML IV EMUL
INTRAVENOUS | Status: DC | PRN
  Administered 2020-08-27: 100 ug/kg/min via INTRAVENOUS

## 2020-08-27 MED ORDER — ONDANSETRON HCL 4 MG PO TABS: 4.0000 mg | ORAL_TABLET | Freq: Four times a day (QID) | ORAL | Status: DC | PRN

## 2020-08-27 MED ORDER — SODIUM CHLORIDE (PF) 0.9 % IJ SOLN
INTRAMUSCULAR | Status: AC
  Filled 2020-08-27: qty 50

## 2020-08-27 MED ORDER — POLYVINYL ALCOHOL 1.4 % OP SOLN: 1.0000 [drp] | OPHTHALMIC | Status: DC | PRN

## 2020-08-27 MED ORDER — ALUM & MAG HYDROXIDE-SIMETH 200-200-20 MG/5ML PO SUSP
30.0000 mL | Freq: Once | ORAL | Status: AC
  Administered 2020-08-27: 30 mL via ORAL
  Filled 2020-08-27: qty 30

## 2020-08-27 MED ORDER — PROPOFOL 500 MG/50ML IV EMUL
INTRAVENOUS | Status: AC
  Filled 2020-08-27: qty 50

## 2020-08-27 MED ORDER — ONDANSETRON HCL 4 MG/2ML IJ SOLN: 4.0000 mg | Freq: Four times a day (QID) | INTRAMUSCULAR | Status: DC | PRN

## 2020-08-27 MED ORDER — LACTATED RINGERS IV SOLN: INTRAVENOUS | Status: DC

## 2020-08-27 MED ORDER — LEVOTHYROXINE SODIUM 50 MCG PO TABS
50.0000 ug | ORAL_TABLET | Freq: Every day | ORAL | Status: DC
  Administered 2020-08-29 – 2020-08-31 (×3): 50 ug via ORAL
  Filled 2020-08-27 (×3): qty 1

## 2020-08-27 SURGICAL SUPPLY — 14 items

## 2020-08-27 NOTE — H&P (Signed)
History and Physical    Austin Pongratz NWG:[id number] DOB: 1930-05-31 DOA: 08/27/2020  PCP: Rudene Anda, MD  Patient coming from: Home  Chief Complaint: diarrhea, weakness  HPI: Kendra Dickson is a 87 y.o. female with medical history significant of chronic diarrhea, hypothyroidism, GERD. Presenting with diarrhea and weakness. She reports that early this morning she had several episodes of very dark diarrhea. It seemed to be everywhere. During her episodes, she became very sweaty and weak. She reports some epigastric pain during this time that seemed mild. The sweatiness and weakness really worried her, so she came to the ED for help. Of note, she states that she believes she's had darker stools for the past 10 days or so. She has not been using NSAIDs. She does not drink alcohol. She denies any other aggravating or alleviating factors.    ED Course: She was found to be anemia. Her stool sample was noted to contain blood. GI was consulted. TRH was called for admission.   Review of Systems:  Denies CP, fevers, hematemesis. Reports palpitations, diaphoresis, N/V, dark stool, weakness. Review of systems is otherwise negative for all not mentioned in HPI.   PMHx Past Medical History:  Diagnosis Date   Arthritis    Asthma    Hypothyroidism     PSHx Past Surgical History:  Procedure Laterality Date   APPENDECTOMY     CESAREAN SECTION     EYE SURGERY     SPINE SURGERY      SocHx  reports that she has never smoked. She has never used smokeless tobacco. She reports that she does not drink alcohol and does not use drugs.  No Known Allergies  FamHx Family History  Problem Relation Age of Onset   Cancer Father        Gallbladder   Pancreatic cancer Father    Cancer Sister        Gallbladder    Prior to Admission medications   Medication Sig Start Date End Date Taking? Authorizing Provider  Alpha-D-Galactosidase (BEANO PO) Take by mouth daily as needed.    [provider]   aspirin 81 MG chewable tablet Chew by mouth daily.    [provider]  Cholecalciferol (VITAMIN D3) 3000 units TABS Take by mouth daily.    [provider]  DENTA 5000 PLUS 1.1 % CREA dental cream USE DAY & NIGHT,BRUSH THOROUGHLY FOR 2 MINS & SPIT OUT. DON'T SWALLOW & RINSE LIGHTLY 10/27/17   [provider]  dicyclomine (BENTYL) 10 MG capsule TAKE 1 CAPSULE (10 MG TOTAL) BY MOUTH 3 (THREE) TIMES DAILY BEFORE MEALS. 07/19/18   Ladene Artist, MD  glycopyrrolate (ROBINUL) 1 MG tablet Take 1 tablet (1 mg total) by mouth 2 (two) times daily. 10/08/18   Ladene Artist, MD  levothyroxine (SYNTHROID) 50 MCG tablet TAKE 1 TABLET BY MOUTH DAILY BEFORE BREAKFAST 09/17/18   Wendie Agreste, MD  Lutein-Zeaxanthin 25-5 MG CAPS Take by mouth.    [provider]  magnesium oxide (MAG-OX) 400 MG tablet Take 400 mg by mouth daily.    [provider]  Multiple Vitamins-Minerals (CENTRUM SILVER PO) Take by mouth daily.    [provider]  omeprazole (PRILOSEC) 20 MG capsule TAKE 1 CAPSULE BY MOUTH EVERY DAY NEEDS APPT FOR FURTHER REFILLS 10/05/18   Ladene Artist, MD  pantoprazole (PROTONIX) 40 MG tablet Take 40 mg by mouth daily. 07/25/20   [provider]  scopolamine (TRANSDERM-SCOP, 1.5 MG,) 1 MG/3DAYS  Place 1 patch (1.5 mg total) onto the skin every 3 (three) days. 08/09/17   Robyn Haber, MD  traZODone (DESYREL) 50 MG tablet TAKE 1 TABLET BY MOUTH EVERYDAY AT BEDTIME 09/17/18   Wendie Agreste, MD    Physical Exam: Vitals:   08/27/20 0830 08/27/20 0930 08/27/20 1030 08/27/20 1100  BP: 109/66 111/62 125/62 137/61  Pulse: 78 78 80 75  Resp: 18 (!) 21 (!) 21 15  Temp:      TempSrc:      SpO2: 100% 100% 100% 100%    General: 87 y.o. frail appearing female resting in bed in NAD Eyes: PERRL, normal sclera ENMT: Nares patent w/o discharge, orophaynx clear, dentition normal, ears w/o discharge/lesions/ulcers Neck: Supple, trachea  midline Cardiovascular: tachy, +S1, S2, no m/g/r, equal pulses throughout Respiratory: CTABL, no w/r/r, normal WOB GI: BS+, NDNT, no masses noted, no organomegaly noted MSK: No e/c/c Skin: No rashes, bruises, ulcerations noted Neuro: A&O x 3, no focal deficits Psyc: Appropriate interaction and affect, calm/cooperative  Labs on Admission: I have personally reviewed following labs and imaging studies  CBC: Recent Labs  Lab 08/27/20 0705  WBC 9.1  HGB 7.5*  HCT 23.3*  MCV 97.1  PLT 779   Basic Metabolic Panel: Recent Labs  Lab 08/27/20 0705  NA 139  K 5.2*  CL 108  CO2 25  GLUCOSE 171*  BUN 46*  CREATININE 0.59  CALCIUM 8.4*   GFR: CrCl cannot be calculated (Unknown ideal weight.). Liver Function Tests: Recent Labs  Lab 08/27/20 0705  AST 16  ALT 10  ALKPHOS 41  BILITOT 0.4  PROT 5.4*  ALBUMIN 2.9*   Recent Labs  Lab 08/27/20 0705  LIPASE 50   No results for input(s): AMMONIA in the last 168 hours. Coagulation Profile: No results for input(s): INR, PROTIME in the last 168 hours. Cardiac Enzymes: No results for input(s): CKTOTAL, CKMB, CKMBINDEX, TROPONINI in the last 168 hours. BNP (last 3 results) No results for input(s): PROBNP in the last 8760 hours. HbA1C: No results for input(s): HGBA1C in the last 72 hours. CBG: No results for input(s): GLUCAP in the last 168 hours. Lipid Profile: No results for input(s): CHOL, HDL, LDLCALC, TRIG, CHOLHDL, LDLDIRECT in the last 72 hours. Thyroid Function Tests: No results for input(s): TSH, T4TOTAL, FREET4, T3FREE, THYROIDAB in the last 72 hours. Anemia Panel: No results for input(s): VITAMINB12, FOLATE, FERRITIN, TIBC, IRON, RETICCTPCT in the last 72 hours. Urine analysis:    Component Value Date/Time   COLORURINE YELLOW 08/27/2020 1200   APPEARANCEUR CLEAR 08/27/2020 1200   APPEARANCEUR Cloudy (A) 04/29/2017 1741   LABSPEC 1.023 08/27/2020 1200   PHURINE 7.0 08/27/2020 1200   GLUCOSEU NEGATIVE  08/27/2020 1200   HGBUR LARGE (A) 08/27/2020 1200   BILIRUBINUR NEGATIVE 08/27/2020 1200   BILIRUBINUR Negative 04/29/2017 1741   KETONESUR NEGATIVE 08/27/2020 1200   PROTEINUR NEGATIVE 08/27/2020 1200   NITRITE NEGATIVE 08/27/2020 1200   LEUKOCYTESUR NEGATIVE 08/27/2020 1200    Radiological Exams on Admission: DG Chest 2 View  Result Date: 08/27/2020 CLINICAL DATA:  Shortness of breath this morning EXAM: CHEST - 2 VIEW COMPARISON:  None. FINDINGS: Borderline heart size. Negative aortic and hilar contours. There is no edema, consolidation, effusion, or pneumothorax. Multiple remote compression fractures in the upper lumbar and thoracic spine with 2 level cement augmentation. Pronounced osteopenia. IMPRESSION: No evidence of acute cardiopulmonary disease. Electronically Signed   By: Monte Fantasia M.D.   On: 08/27/2020 07:12   CT  Abdomen Pelvis W Contrast  Result Date: 08/27/2020 CLINICAL DATA:  Abdominal distension. Abdominal pain and diarrhea for 3 days EXAM: CT ABDOMEN AND PELVIS WITH CONTRAST TECHNIQUE: Multidetector CT imaging of the abdomen and pelvis was performed using the standard protocol following bolus administration of intravenous contrast. CONTRAST:  31mL OMNIPAQUE IOHEXOL 300 MG/ML  SOLN COMPARISON:  None. FINDINGS: Lower chest: Borderline heart size. Extensive aortic and coronary atherosclerosis. Bands of scarring in the bilateral bases. Hepatobiliary: Cholecystectomy which likely accounts for mild intrahepatic biliary dilatation given normal biliary labs.Simple cyst in the caudate lobe measuring 15 mm. Pancreas: Subcentimeter simple cyst in the uncinate process and head without ductal dilatation, considered incidental and non worrisome given patient age. Spleen: Unremarkable. Adrenals/Urinary Tract: Negative adrenals. No hydronephrosis or stone. Unremarkable bladder. Stomach/Bowel: Numerous colonic diverticula. Colonic fluid levels are seen to the descending segment diffuse bowel  gas which may be ingested. Oral contrast reaches the proximal colon. Vascular/Lymphatic: No acute vascular abnormality. Atheromatous calcification of the aorta and iliacs which is extensive. The aorta is tortuous with aneurysmal enlargement below the renal arteries up to 4.1 Cm. Recommend follow-up every 12 months and vascular consultation. This recommendation follows ACR consensus guidelines: White Paper of the ACR Incidental Findings Committee II on Vascular Findings. J Am Coll Radiol 2013; 10:789-794. No mass or adenopathy. Reproductive:Unremarkable for age Other: No ascites or pneumoperitoneum. Musculoskeletal: Generalized osteopenia. Remote compression fractures of T12-L3 with cement augmentation at T12 and L1. No acute osseous finding IMPRESSION: 1. Diffuse small bowel gas without obstruction or visible inflammation. 2. Extensive atherosclerosis including the coronary arteries. Fusiform abdominal aortic aneurysm measuring up to 4.1 cm. If appropriate for comorbidities, follow-up recommendations are above. 3. Left colonic diverticula. Electronically Signed   By: Monte Fantasia M.D.   On: 08/27/2020 10:48    EKG: Independently reviewed. Sinus, no st elevation  Assessment/Plan GIB Symptomatic anemia     - place in tele, obs     - Hgb in 4/22 was 11.5; it's 7.5 today     - will go for scope today     - get 2 units pRBCs     - follow q6h H&H  Hypothyroidism     - resume home synthroid  GERD     - protonix gtt as above  Elevated troponin     -  mild elevation in trp; no chest pain, EKG is ok     - likely a demand ischemia from her GIB  ?Hx of chronic diarrhea w/ pancreatic insufficiency     - this is per GI from [facility] (Dr. Earlean Shawl)     - was prescribed creon at that time and instructed to restrict from lactose; but didn't not appear to be compliant.      - continue GI follow up  DVT prophylaxis: SCDs  Code Status: FULL  Family Communication: w/ dtr by phone Consults  called: [facility]   Status is: Observation  The patient remains OBS appropriate and will d/c before 2 midnights.  Dispo: The patient is from: Home              Anticipated d/c is to: Home              Patient currently is not medically stable to d/c.   Difficult to place patient No  Time spent coordinating admission: 70 minutes  [facility]  If 7PM-7AM, please contact night-coverage [email or web address]  08/27/2020, 12:41 PM

## 2020-08-27 NOTE — ED Provider Notes (Addendum)
[facility] DEPT Provider Note   CSN: [id number] Arrival date & time: 08/27/20  1540     History Chief Complaint  Patient presents with   Abdominal Pain    Kendra Dickson is a 87 y.o. female.  87 year old female who presents with abdominal pain times several days.  Patient speaks [place] and a video interpreter was used.  Patient states the pain is been epigastric and goes to her left upper quadrant.  It has not been associated with fever.  No associated with eating.  Slight loose stools.  Pain seems to wax and wane and not associated with urinary symptoms.  Patient states he gets this often and is unsure why she came here today.  Does take medications for her stomach she says but those did not help today      Past Medical History:  Diagnosis Date   Arthritis    Asthma    Hypothyroidism     There are no problems to display for this patient.   Past Surgical History:  Procedure Laterality Date   APPENDECTOMY     CESAREAN SECTION     EYE SURGERY     SPINE SURGERY       OB History   No obstetric history on file.     Family History  Problem Relation Age of Onset   Cancer Father        Gallbladder   Pancreatic cancer Father    Cancer Sister        Gallbladder    Social History   Tobacco Use   Smoking status: Never   Smokeless tobacco: Never  Vaping Use   Vaping Use: Never used  Substance Use Topics   Alcohol use: No   Drug use: No    Home Medications Prior to Admission medications   Medication Sig Start Date End Date Taking? Authorizing Provider  Alpha-D-Galactosidase (BEANO PO) Take by mouth daily as needed.    [provider]  aspirin 81 MG chewable tablet Chew by mouth daily.    [provider]  Cholecalciferol (VITAMIN D3) 3000 units TABS Take by mouth daily.    [provider]  DENTA 5000 PLUS 1.1 % CREA dental cream USE DAY & NIGHT,BRUSH THOROUGHLY FOR 2 MINS & SPIT OUT. DON'T SWALLOW & RINSE  LIGHTLY 10/27/17   [provider]  dicyclomine (BENTYL) 10 MG capsule TAKE 1 CAPSULE (10 MG TOTAL) BY MOUTH 3 (THREE) TIMES DAILY BEFORE MEALS. 07/19/18   Ladene Artist, MD  glycopyrrolate (ROBINUL) 1 MG tablet Take 1 tablet (1 mg total) by mouth 2 (two) times daily. 10/08/18   Ladene Artist, MD  levothyroxine (SYNTHROID) 50 MCG tablet TAKE 1 TABLET BY MOUTH DAILY BEFORE BREAKFAST 09/17/18   Wendie Agreste, MD  Lutein-Zeaxanthin 25-5 MG CAPS Take by mouth.    [provider]  magnesium oxide (MAG-OX) 400 MG tablet Take 400 mg by mouth daily.    [provider]  Multiple Vitamins-Minerals (CENTRUM SILVER PO) Take by mouth daily.    [provider]  omeprazole (PRILOSEC) 20 MG capsule TAKE 1 CAPSULE BY MOUTH EVERY DAY NEEDS APPT FOR FURTHER REFILLS 10/05/18   Ladene Artist, MD  scopolamine (TRANSDERM-SCOP, 1.5 MG,) 1 MG/3DAYS Place 1 patch (1.5 mg total) onto the skin every 3 (three) days. 08/09/17   Robyn Haber, MD  traZODone (DESYREL) 50 MG tablet TAKE 1 TABLET BY MOUTH EVERYDAY AT BEDTIME 09/17/18   Wendie Agreste, MD  Allergies    Patient has no known allergies.  Review of Systems   Review of Systems  All other systems reviewed and are negative.  Physical Exam Updated Vital Signs BP (!) 105/52 (BP Location: Left Arm)   Pulse 75   Temp (!) 97.5 F (36.4 C) (Oral)   Resp 15   SpO2 99%   Physical Exam Vitals and nursing note reviewed.  Constitutional:      General: She is not in acute distress.    Appearance: Normal appearance. She is well-developed. She is not toxic-appearing.  HENT:     Head: Normocephalic and atraumatic.  Eyes:     General: Lids are normal.     Conjunctiva/sclera: Conjunctivae normal.     Pupils: Pupils are equal, round, and reactive to light.  Neck:     Thyroid: No thyroid mass.     Trachea: No tracheal deviation.  Cardiovascular:     Rate and Rhythm: Normal rate and regular rhythm.     Heart sounds:  Normal heart sounds. No murmur heard.   No gallop.  Pulmonary:     Effort: Pulmonary effort is normal. No respiratory distress.     Breath sounds: Normal breath sounds. No stridor. No decreased breath sounds, wheezing, rhonchi or rales.  Abdominal:     General: Bowel sounds are normal. There is no distension.     Palpations: Abdomen is soft.     Tenderness: There is abdominal tenderness in the epigastric area. There is no rebound.  Genitourinary:    Comments: Gross blood per rectum noted Musculoskeletal:        General: No tenderness. Normal range of motion.     Cervical back: Normal range of motion and neck supple.  Skin:    General: Skin is warm and dry.     Findings: No abrasion or rash.  Neurological:     Mental Status: She is alert and oriented to person, place, and time.     GCS: GCS eye subscore is 4. GCS verbal subscore is 5. GCS motor subscore is 6.     Cranial Nerves: No cranial nerve deficit.     Sensory: No sensory deficit.  Psychiatric:        Speech: Speech normal.        Behavior: Behavior normal.      ED Results / Procedures / Treatments   Labs (all labs ordered are listed, but only abnormal results are displayed) Labs Reviewed  RESP PANEL BY RT-PCR (FLU A&B, COVID) [id number]  LIPASE, BLOOD  COMPREHENSIVE METABOLIC PANEL  CBC  URINALYSIS, ROUTINE W REFLEX MICROSCOPIC  TROPONIN I (HIGH SENSITIVITY)    EKG EKG Interpretation  Date/Time:  Monday August 27 2020 06:48:30 EDT Ventricular Rate:  75 PR Interval:  149 QRS Duration: 100 QT Interval:  393 QTC Calculation: 439 R Axis:   39 Text Interpretation: Sinus rhythm Low voltage, precordial leads Confirmed by Lacretia Leigh ([id number]) on 08/27/2020 8:05:44 AM  Radiology DG Chest 2 View  Result Date: 08/27/2020 CLINICAL DATA:  Shortness of breath this morning EXAM: CHEST - 2 VIEW COMPARISON:  None. FINDINGS: Borderline heart size. Negative aortic and hilar contours. There is no edema, consolidation, effusion,  or pneumothorax. Multiple remote compression fractures in the upper lumbar and thoracic spine with 2 level cement augmentation. Pronounced osteopenia. IMPRESSION: No evidence of acute cardiopulmonary disease. Electronically Signed   By: Monte Fantasia M.D.   On: 08/27/2020 07:12    Procedures Procedures   Medications Ordered in ED  Medications - No data to display  ED Course  I have reviewed the triage vital signs and the nursing notes.  Pertinent labs & imaging results that were available during my care of the patient were reviewed by me and considered in my medical decision making (see chart for details).    MDM Rules/Calculators/A&P                          Patient with negative COVID test and EKG without acute findings.  Troponin did increase from baseline with second lab draw.  Patient's hemoglobin noted at 7.5.  Will order type and screen.  Will have stools guaiac by nursing.  Will require admission for further evaluation 11:59 AM Patient have a grossly bloody stools.  Will consult GI Final Clinical Impression(s) / ED Diagnoses Final diagnoses:  None    Rx / DC Orders ED Discharge Orders     None        Lacretia Leigh, MD 08/27/20 1137    Lacretia Leigh, MD 08/27/20 1159

## 2020-08-27 NOTE — Consult Note (Signed)
Referring Provider: Dr. Cherylann Ratel Primary Care Physician:  Rudene Anda, MD Primary Gastroenterologist:  [facility]  Reason for Consultation: Melena, symptomatic anemia  HPI: Kendra Dickson is a 87 y.o. female with history of chronic diarrhea, hypothyroidism, and asthma presenting for consultation of melena and symptomatic anemia.  Patient stated early this morning she had several episodes of black diarrhea. She felt very weak and sweaty and thus presented to the ED.  She had some mild epigastric pain at that time.  Prior to this morning, she noted melenic stools for approximately 10 days.  Denies hematochezia, dysphagia, changes in appetite, unexplained weight loss.  Has had recent dyspnea on exertion. Denies chest pain.  Denies NSAID or blood thinner use.  Takes 81 mg ASA daily.  Family history pertinent for father and sister with pancreatic cancer.  Patient reports prior EGD a few years ago, though not on file (per chart review, reportedly normal EGD and colonoscopy several years ago in [place]).  [employer] video interpreting services was utilized for this encounter [facility] Jolyn Lent [phone])  Past Medical History:  Diagnosis Date   Arthritis    Asthma    Hypothyroidism     Past Surgical History:  Procedure Laterality Date   APPENDECTOMY     CESAREAN SECTION     EYE SURGERY     SPINE SURGERY      Prior to Admission medications   Medication Sig Start Date End Date Taking? Authorizing Provider  Alpha-D-Galactosidase (BEANO PO) Take by mouth daily as needed.    [provider]  aspirin 81 MG chewable tablet Chew by mouth daily.    [provider]  Cholecalciferol (VITAMIN D3) 3000 units TABS Take by mouth daily.    [provider]  DENTA 5000 PLUS 1.1 % CREA dental cream USE DAY & NIGHT,BRUSH THOROUGHLY FOR 2 MINS & SPIT OUT. DON'T SWALLOW & RINSE LIGHTLY 10/27/17   [provider]  dicyclomine (BENTYL) 10 MG capsule TAKE 1 CAPSULE (10 MG TOTAL) BY MOUTH 3  (THREE) TIMES DAILY BEFORE MEALS. 07/19/18   Ladene Artist, MD  glycopyrrolate (ROBINUL) 1 MG tablet Take 1 tablet (1 mg total) by mouth 2 (two) times daily. 10/08/18   Ladene Artist, MD  levothyroxine (SYNTHROID) 50 MCG tablet TAKE 1 TABLET BY MOUTH DAILY BEFORE BREAKFAST 09/17/18   Wendie Agreste, MD  Lutein-Zeaxanthin 25-5 MG CAPS Take by mouth.    [provider]  magnesium oxide (MAG-OX) 400 MG tablet Take 400 mg by mouth daily.    [provider]  Multiple Vitamins-Minerals (CENTRUM SILVER PO) Take by mouth daily.    [provider]  omeprazole (PRILOSEC) 20 MG capsule TAKE 1 CAPSULE BY MOUTH EVERY DAY NEEDS APPT FOR FURTHER REFILLS 10/05/18   Ladene Artist, MD  pantoprazole (PROTONIX) 40 MG tablet Take 40 mg by mouth daily. 07/25/20   [provider]  scopolamine (TRANSDERM-SCOP, 1.5 MG,) 1 MG/3DAYS Place 1 patch (1.5 mg total) onto the skin every 3 (three) days. 08/09/17   Robyn Haber, MD  traZODone (DESYREL) 50 MG tablet TAKE 1 TABLET BY MOUTH EVERYDAY AT BEDTIME 09/17/18   Wendie Agreste, MD    Scheduled Meds: Continuous Infusions:  sodium chloride     lactated ringers 125 mL/hr at 08/27/20 0932   PRN Meds:.  Allergies as of 08/27/2020   (No Known Allergies)    Family History  Problem Relation Age of Onset   Cancer Father  Gallbladder   Pancreatic cancer Father    Cancer Sister        Gallbladder    Social History   Socioeconomic History   Marital status: Widowed    Spouse name: Not on file   Number of children: 3   Years of education: Not on file   Highest education level: Not on file  Occupational History   Not on file  Tobacco Use   Smoking status: Never   Smokeless tobacco: Never  Vaping Use   Vaping Use: Never used  Substance and Sexual Activity   Alcohol use: No   Drug use: No   Sexual activity: Never  Other Topics Concern   Not on file  Social History Narrative   She is from [place]. Moved here  in 2017. Lives with son and daughter in law. Has three children.    Social Determinants of Health   Financial Resource Strain: Not on file  Food Insecurity: Not on file  Transportation Needs: Not on file  Physical Activity: Not on file  Stress: Not on file  Social Connections: Not on file  Intimate Partner Violence: Not on file    Review of Systems: Review of Systems  Constitutional:  Positive for malaise/fatigue. Negative for weight loss.  HENT:  Negative for hearing loss and tinnitus.   Eyes:  Negative for pain and redness.  Respiratory:  Negative for cough and shortness of breath.   Cardiovascular:  Negative for chest pain and palpitations.  Gastrointestinal:  Positive for abdominal pain, diarrhea, heartburn, melena and nausea. Negative for blood in stool, constipation and vomiting.  Genitourinary:  Negative for flank pain and hematuria.  Musculoskeletal:  Negative for falls and joint pain.  Skin:  Negative for itching and rash.  Neurological:  Positive for weakness. Negative for loss of consciousness.  Endo/Heme/Allergies:  Negative for polydipsia. Does not bruise/bleed easily.  Psychiatric/Behavioral:  Negative for substance abuse. The patient is not nervous/anxious.     Physical Exam: Vital signs: Vitals:   08/27/20 1030 08/27/20 1100  BP: 125/62 137/61  Pulse: 80 75  Resp: (!) 21 15  Temp:    SpO2: 100% 100%     Physical Exam Vitals reviewed.  Constitutional:      General: She is not in acute distress. HENT:     Head: Normocephalic and atraumatic.     Nose: Nose normal. No congestion.     Mouth/Throat:     Mouth: Mucous membranes are moist.     Pharynx: Oropharynx is clear.  Eyes:     General: No scleral icterus.    Extraocular Movements: Extraocular movements intact.     Comments: Conjunctival pallor  Cardiovascular:     Rate and Rhythm: Normal rate and regular rhythm.     Pulses: Normal pulses.  Pulmonary:     Effort: Pulmonary effort is normal. No  respiratory distress.  Abdominal:     General: Abdomen is flat. Bowel sounds are normal. There is no distension.     Palpations: Abdomen is soft. There is no mass.     Tenderness: There is no abdominal tenderness. There is no guarding or rebound.     Hernia: No hernia is present.  Musculoskeletal:        General: No swelling or tenderness.     Cervical back: Normal range of motion and neck supple.  Skin:    General: Skin is warm and dry.     Coloration: Skin is pale.  Neurological:  General: No focal deficit present.     Mental Status: She is oriented to person, place, and time. She is lethargic.  Psychiatric:        Mood and Affect: Mood normal.        Behavior: Behavior normal. Behavior is cooperative.     GI:  Lab Results: Recent Labs    08/27/20 0705  WBC 9.1  HGB 7.5*  HCT 23.3*  PLT 221   BMET Recent Labs    08/27/20 0705  NA 139  K 5.2*  CL 108  CO2 25  GLUCOSE 171*  BUN 46*  CREATININE 0.59  CALCIUM 8.4*   LFT Recent Labs    08/27/20 0705  PROT 5.4*  ALBUMIN 2.9*  AST 16  ALT 10  ALKPHOS 41  BILITOT 0.4   PT/INR No results for input(s): LABPROT, INR in the last 72 hours.   Studies/Results: DG Chest 2 View  Result Date: 08/27/2020 CLINICAL DATA:  Shortness of breath this morning EXAM: CHEST - 2 VIEW COMPARISON:  None. FINDINGS: Borderline heart size. Negative aortic and hilar contours. There is no edema, consolidation, effusion, or pneumothorax. Multiple remote compression fractures in the upper lumbar and thoracic spine with 2 level cement augmentation. Pronounced osteopenia. IMPRESSION: No evidence of acute cardiopulmonary disease. Electronically Signed   By: Monte Fantasia M.D.   On: 08/27/2020 07:12   CT Abdomen Pelvis W Contrast  Result Date: 08/27/2020 CLINICAL DATA:  Abdominal distension. Abdominal pain and diarrhea for 3 days EXAM: CT ABDOMEN AND PELVIS WITH CONTRAST TECHNIQUE: Multidetector CT imaging of the abdomen and pelvis was  performed using the standard protocol following bolus administration of intravenous contrast. CONTRAST:  34mL OMNIPAQUE IOHEXOL 300 MG/ML  SOLN COMPARISON:  None. FINDINGS: Lower chest: Borderline heart size. Extensive aortic and coronary atherosclerosis. Bands of scarring in the bilateral bases. Hepatobiliary: Cholecystectomy which likely accounts for mild intrahepatic biliary dilatation given normal biliary labs.Simple cyst in the caudate lobe measuring 15 mm. Pancreas: Subcentimeter simple cyst in the uncinate process and head without ductal dilatation, considered incidental and non worrisome given patient age. Spleen: Unremarkable. Adrenals/Urinary Tract: Negative adrenals. No hydronephrosis or stone. Unremarkable bladder. Stomach/Bowel: Numerous colonic diverticula. Colonic fluid levels are seen to the descending segment diffuse bowel gas which may be ingested. Oral contrast reaches the proximal colon. Vascular/Lymphatic: No acute vascular abnormality. Atheromatous calcification of the aorta and iliacs which is extensive. The aorta is tortuous with aneurysmal enlargement below the renal arteries up to 4.1 Cm. Recommend follow-up every 12 months and vascular consultation. This recommendation follows ACR consensus guidelines: White Paper of the ACR Incidental Findings Committee II on Vascular Findings. J Am Coll Radiol 2013; 10:789-794. No mass or adenopathy. Reproductive:Unremarkable for age Other: No ascites or pneumoperitoneum. Musculoskeletal: Generalized osteopenia. Remote compression fractures of T12-L3 with cement augmentation at T12 and L1. No acute osseous finding IMPRESSION: 1. Diffuse small bowel gas without obstruction or visible inflammation. 2. Extensive atherosclerosis including the coronary arteries. Fusiform abdominal aortic aneurysm measuring up to 4.1 cm. If appropriate for comorbidities, follow-up recommendations are above. 3. Left colonic diverticula. Electronically Signed   By: Monte Fantasia M.D.   On: 08/27/2020 10:48    Impression: Melena, anemia -BUN elevated (46) with normal Cr (0.59), concerning for upper GI bleeding.   -Hgb 7.5, decreased from baseline 11.5 in 06/2020  Plan: EGD today, pending anesthesia/endoscopy unit availability.  I thoroughly discussed the procedure with the patient to include nature, alternatives, benefits, and risks (including but not  limited to bleeding, infection, perforation, anesthesia/cardiac and pulmonary complications).  Patient verbalized understanding and gave verbal consent to proceed with EGD.  Start Protonix IV drip.  Continue to monitor H&H with transfusion as needed to maintain Hgb >7-8.    [facility] will follow.    LOS: 0 days   Salley Slaughter  PA-C 08/27/2020, 12:12 PM  Contact #  [phone]

## 2020-08-27 NOTE — ED Triage Notes (Addendum)
PT BIB EMS from home. C/o abdominal pain and diarrhea x3days. 500 ns given by ems. Pt is pale

## 2020-08-27 NOTE — Progress Notes (Signed)
General surgery away.  Patient in endo.  Will see in the am.    Henreitta Cea 4:11 PM 08/27/2020

## 2020-08-27 NOTE — ED Notes (Signed)
Pt taken to Endo  

## 2020-08-27 NOTE — Anesthesia Postprocedure Evaluation (Signed)
Anesthesia Post Note  Patient: Kendra Dickson  Procedure(s) Performed: ESOPHAGOGASTRODUODENOSCOPY (EGD) WITH PROPOFOL BIOPSY     Patient location during evaluation: Endoscopy Anesthesia Type: MAC Level of consciousness: awake and alert Pain management: pain level controlled Vital Signs Assessment: post-procedure vital signs reviewed and stable Respiratory status: spontaneous breathing, nonlabored ventilation and respiratory function stable Cardiovascular status: blood pressure returned to baseline and stable Postop Assessment: no apparent nausea or vomiting Anesthetic complications: no   No notable events documented.  Last Vitals:  Vitals:   08/27/20 1630 08/27/20 1639  BP: 116/75 (!) 141/66  Pulse: 68 69  Resp: 20 16  Temp:    SpO2: 97% 100%    Last Pain:  Vitals:   08/27/20 1616  TempSrc: Axillary  PainSc: 0-No pain                 Lidia Collum

## 2020-08-27 NOTE — Op Note (Signed)
[facility] Patient Name: Kendra Dickson Procedure Date: 08/27/2020 MRN: [id number] Attending MD: Ronnette Juniper , MD Date of Birth: [birth year] CSN: [id number] Age: 87 Admit Type: Inpatient Procedure:                Upper GI endoscopy Indications:              Epigastric abdominal pain, Hematochezia, Melena Providers:                Ronnette Juniper, MD Referring MD:             [facility] Medicines:                Monitored Anesthesia Care Complications:            No immediate complications. Estimated blood loss:                            Minimal. Estimated Blood Loss:     Estimated blood loss was minimal. Procedure:                Pre-Anesthesia Assessment:                           - Prior to the procedure, a History and Physical                            was performed, and patient medications and                            allergies were reviewed. The patient's tolerance of                            previous anesthesia was also reviewed. The risks                            and benefits of the procedure and the sedation                            options and risks were discussed with the patient.                            All questions were answered, and informed consent                            was obtained. Prior Anticoagulants: The patient has                            taken no previous anticoagulant or antiplatelet                            agents except for aspirin. ASA Grade Assessment:                            III - A patient with severe systemic disease. After  reviewing the risks and benefits, the patient was                            deemed in satisfactory condition to undergo the                            procedure.                           After obtaining informed consent, the endoscope was                            passed under direct vision. Throughout the                            procedure, the patient's blood  pressure, pulse, and                            oxygen saturations were monitored continuously. The                            GIF-H190 ([id number]) Olympus gastroscope was                            introduced through the mouth, and advanced to the                            duodenal bulb. The [id number]-PH190L ([id number]) Olympus                            ultra slim endoscope was introduced through the and                            advanced to the. The upper GI endoscopy was                            accomplished without difficulty. The patient                            tolerated the procedure well. Scope In: Scope Out: Findings:      The examined esophagus was normal.      The Z-line was regular and was found 38 cm from the incisors.      The entire examined stomach was normal.      A small fleck of blood was noted in the fundus and a small clot was       noted coming from the duodenal bulb into the antrum.      The cardia and gastric fundus were normal on retroflexion.      A large fungating, infiltrative and ulcerated mass, obstructing the       lumen, with bleeding was found in the first portion of the duodenum.       Biopsies were taken with a cold forceps for histology.      The adult gastroscope was exchanged with an ultrathin colonoscope which       could not be  advanced beyond the ulcerated, obstructive mass.      A neanatal gastroscope was used subsequently, however,even that scope       could not be advanced beyond the ulcerated mass. Impression:               - Normal esophagus.                           - Z-line regular, 38 cm from the incisors.                           - Normal stomach.                           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 Moderate Sedation:      Patient did not receive moderate sedation for this procedure, but       instead received monitored anesthesia care. Recommendation:           - NPO.                           - Give Protonix  (pantoprazole): 8 mg/hr IV by                            continuous infusion.                           - Await pathology results.                           - Refer to a surgeon today. Procedure Code(s):        --- Professional ---                           [phone], Esophagogastroduodenoscopy, flexible,                            transoral; with biopsy, single or multiple Diagnosis Code(s):        --- Professional ---                           K31.89, Other diseases of stomach and duodenum                           R10.13, Epigastric pain                           K92.1, Melena (includes Hematochezia) CPT copyright 2019 American Medical Association. All rights reserved. The codes documented in this report are preliminary and upon coder review may  be revised to meet current compliance requirements. Ronnette Juniper, MD 08/27/2020 3:49:34 PM This report has been signed electronically. Number of Addenda: 0

## 2020-08-27 NOTE — ED Notes (Signed)
Pt currently in X-ray.

## 2020-08-27 NOTE — Anesthesia Procedure Notes (Signed)
Procedure Name: MAC Date/Time: 08/27/2020 3:11 PM Performed by: Lissa Morales, CRNA Pre-anesthesia Checklist: Patient identified, Emergency Drugs available, Suction available, Patient being monitored and Timeout performed Patient Re-evaluated:Patient Re-evaluated prior to induction Oxygen Delivery Method: Simple face mask Placement Confirmation: positive ETCO2

## 2020-08-27 NOTE — Anesthesia Preprocedure Evaluation (Signed)
Anesthesia Evaluation  Patient identified by MRN, date of birth, ID band Patient awake    Reviewed: Allergy & Precautions, NPO status , Patient's Chart, lab work & pertinent test results  Airway Mallampati: I       Dental  (+) Poor Dentition   Pulmonary    Pulmonary exam normal        Cardiovascular negative cardio ROS Normal cardiovascular exam     Neuro/Psych negative neurological ROS  negative psych ROS   GI/Hepatic Neg liver ROS, GERD  Medicated,  Endo/Other  Hypothyroidism   Renal/GU negative Renal ROS  negative genitourinary   Musculoskeletal   Abdominal Normal abdominal exam  (+)   Peds  Hematology  (+) anemia ,   Anesthesia Other Findings   Reproductive/Obstetrics                             Anesthesia Physical Anesthesia Plan  ASA: 2  Anesthesia Plan: MAC   Post-op Pain Management:    Induction:   PONV Risk Score and Plan: TIVA and Propofol infusion  Airway Management Planned: Natural Airway and Mask  Additional Equipment: None  Intra-op Plan:   Post-operative Plan:   Informed Consent: I have reviewed the patients History and Physical, chart, labs and discussed the procedure including the risks, benefits and alternatives for the proposed anesthesia with the patient or authorized representative who has indicated his/her understanding and acceptance.     Dental advisory given  Plan Discussed with: CRNA  Anesthesia Plan Comments:         Anesthesia Quick Evaluation

## 2020-08-28 DIAGNOSIS — E43 Unspecified severe protein-calorie malnutrition: Secondary | ICD-10-CM | POA: Diagnosis present

## 2020-08-28 DIAGNOSIS — D649 Anemia, unspecified: Secondary | ICD-10-CM | POA: Diagnosis not present

## 2020-08-28 DIAGNOSIS — E875 Hyperkalemia: Secondary | ICD-10-CM | POA: Diagnosis present

## 2020-08-28 DIAGNOSIS — C801 Malignant (primary) neoplasm, unspecified: Secondary | ICD-10-CM | POA: Diagnosis not present

## 2020-08-28 DIAGNOSIS — Z800 Family history of malignant neoplasm of digestive organs: Secondary | ICD-10-CM | POA: Diagnosis not present

## 2020-08-28 DIAGNOSIS — G8929 Other chronic pain: Secondary | ICD-10-CM | POA: Diagnosis present

## 2020-08-28 DIAGNOSIS — Z79899 Other long term (current) drug therapy: Secondary | ICD-10-CM | POA: Diagnosis not present

## 2020-08-28 DIAGNOSIS — M1990 Unspecified osteoarthritis, unspecified site: Secondary | ICD-10-CM | POA: Diagnosis present

## 2020-08-28 DIAGNOSIS — I959 Hypotension, unspecified: Secondary | ICD-10-CM | POA: Diagnosis not present

## 2020-08-28 DIAGNOSIS — Z515 Encounter for palliative care: Secondary | ICD-10-CM | POA: Diagnosis not present

## 2020-08-28 DIAGNOSIS — R54 Age-related physical debility: Secondary | ICD-10-CM | POA: Diagnosis present

## 2020-08-28 DIAGNOSIS — Z681 Body mass index (BMI) 19 or less, adult: Secondary | ICD-10-CM | POA: Diagnosis not present

## 2020-08-28 DIAGNOSIS — D62 Acute posthemorrhagic anemia: Secondary | ICD-10-CM | POA: Diagnosis present

## 2020-08-28 DIAGNOSIS — K922 Gastrointestinal hemorrhage, unspecified: Secondary | ICD-10-CM | POA: Diagnosis not present

## 2020-08-28 DIAGNOSIS — R1013 Epigastric pain: Secondary | ICD-10-CM | POA: Diagnosis present

## 2020-08-28 DIAGNOSIS — Z7982 Long term (current) use of aspirin: Secondary | ICD-10-CM | POA: Diagnosis not present

## 2020-08-28 DIAGNOSIS — D696 Thrombocytopenia, unspecified: Secondary | ICD-10-CM | POA: Diagnosis present

## 2020-08-28 DIAGNOSIS — I21A1 Myocardial infarction type 2: Secondary | ICD-10-CM | POA: Diagnosis present

## 2020-08-28 DIAGNOSIS — Z7189 Other specified counseling: Secondary | ICD-10-CM | POA: Diagnosis not present

## 2020-08-28 DIAGNOSIS — E739 Lactose intolerance, unspecified: Secondary | ICD-10-CM | POA: Diagnosis present

## 2020-08-28 DIAGNOSIS — Z20822 Contact with and (suspected) exposure to covid-19: Secondary | ICD-10-CM | POA: Diagnosis present

## 2020-08-28 DIAGNOSIS — C170 Malignant neoplasm of duodenum: Secondary | ICD-10-CM | POA: Diagnosis present

## 2020-08-28 DIAGNOSIS — Z801 Family history of malignant neoplasm of trachea, bronchus and lung: Secondary | ICD-10-CM | POA: Diagnosis not present

## 2020-08-28 DIAGNOSIS — C269 Malignant neoplasm of ill-defined sites within the digestive system: Secondary | ICD-10-CM | POA: Diagnosis not present

## 2020-08-28 DIAGNOSIS — E039 Hypothyroidism, unspecified: Secondary | ICD-10-CM | POA: Diagnosis present

## 2020-08-28 DIAGNOSIS — R64 Cachexia: Secondary | ICD-10-CM | POA: Diagnosis present

## 2020-08-28 DIAGNOSIS — K219 Gastro-esophageal reflux disease without esophagitis: Secondary | ICD-10-CM | POA: Diagnosis present

## 2020-08-28 DIAGNOSIS — Z66 Do not resuscitate: Secondary | ICD-10-CM | POA: Diagnosis not present

## 2020-08-28 DIAGNOSIS — J45909 Unspecified asthma, uncomplicated: Secondary | ICD-10-CM | POA: Diagnosis present

## 2020-08-28 DIAGNOSIS — Z79890 Hormone replacement therapy (postmenopausal): Secondary | ICD-10-CM | POA: Diagnosis not present

## 2020-08-28 LAB — COMPREHENSIVE METABOLIC PANEL
ALT: 12 U/L (ref 0–44)
AST: 20 U/L (ref 15–41)
Albumin: 2.5 g/dL — ABNORMAL LOW (ref 3.5–5.0)
Alkaline Phosphatase: 35 U/L — ABNORMAL LOW (ref 38–126)
Anion gap: 3 — ABNORMAL LOW (ref 5–15)
BUN: 17 mg/dL (ref 8–23)
CO2: 26 mmol/L (ref 22–32)
Calcium: 7.7 mg/dL — ABNORMAL LOW (ref 8.9–10.3)
Chloride: 108 mmol/L (ref 98–111)
Creatinine, Ser: 0.5 mg/dL (ref 0.44–1.00)
GFR, Estimated: >60 mL/min (ref >60)
Glucose, Bld: 82 mg/dL (ref 70–99)
Potassium: 3.7 mmol/L (ref 3.5–5.1)
Sodium: 137 mmol/L (ref 135–145)
Total Bilirubin: 0.8 mg/dL (ref 0.3–1.2)
Total Protein: 4.6 g/dL — ABNORMAL LOW (ref 6.5–8.1)

## 2020-08-28 LAB — CBC
HCT: 25.6 % — ABNORMAL LOW (ref 36.0–46.0)
Hemoglobin: 8.6 g/dL — ABNORMAL LOW (ref 12.0–15.0)
MCH: 30.2 pg (ref 26.0–34.0)
MCHC: 33.6 g/dL (ref 30.0–36.0)
MCV: 89.8 fL (ref 80.0–100.0)
Platelets: 136 10*3/uL — ABNORMAL LOW (ref 150–400)
RBC: 2.85 MIL/uL — ABNORMAL LOW (ref 3.87–5.11)
RDW: 15.5 % (ref 11.5–15.5)
WBC: 5.9 10*3/uL (ref 4.0–10.5)
nRBC: 0 % (ref 0.0–0.2)

## 2020-08-28 LAB — CANCER ANTIGEN 19-9: CA 19-9: 178 U/mL — ABNORMAL HIGH (ref 0–35)

## 2020-08-28 LAB — CEA: CEA: 6.5 ng/mL — ABNORMAL HIGH (ref 0.0–4.7)

## 2020-08-28 MED ORDER — MIDODRINE HCL 5 MG PO TABS
10.0000 mg | ORAL_TABLET | Freq: Three times a day (TID) | ORAL | Status: AC
  Administered 2020-08-28 (×3): 10 mg via ORAL
  Filled 2020-08-28 (×3): qty 2

## 2020-08-28 MED ORDER — BOOST / RESOURCE BREEZE PO LIQD CUSTOM
1.0000 | Freq: Two times a day (BID) | ORAL | Status: DC
  Administered 2020-08-28 – 2020-08-31 (×4): 1 via ORAL

## 2020-08-28 NOTE — Consult Note (Signed)
Kendra Dickson [birth year]  [id number].    Requesting MD: Dr. Cherylann Ratel Chief Complaint/Reason for Consult: Obstructing duodenal mass  HPI: Kendra Dickson is a 87 y.o. female with a hx of hypothyroidism and asthma, per chart as family states her only medical history is chronic diarrhea from lactose intolerance, who presented to the ED on 6/13 with melanotic diarrhea. All history is obtained from the daughter in law as the patient doesn't speak any [place].  She states the patient called her from downstairs as she had diarrhea.  It was dark black.  They called EMS.  She apparently felt very weak and diaphoretic. No associated fever or n/v. She underwent CT A/P w/ IV contrast that showed diffuse small bowel gas without obstruction or visible inflammation. She was noted to be anemic with hgb of 7.5 from her baseline of ~10 on prior labs from 2019. She was admitted to [facility] and GI was consulted. She underwent EGD by Dr. Therisa Doyne on 6/13 that showed a large fungating, infiltrative and ulcerated mass, obstructing the lumen, with bleeding found in the first portion of the duodenum. Biopsies were taken. An adult gastroscope and ultrathin colonoscope could not be advanced beyond the ulcerated, obstructive mass. She was started on PPI gtt post-op. She received 2U PRBC post-op. Hgb 7.5 pre-transfusion > 9.3 post transfusion > 8.6 this AM. Patient became hypotensive overnight w/ MAP 58. She was started on Midodrine 10mg  TID to maintain MAP > 65. CA 19-9 178 and CEA 6.5.  Family reports that prior to yesterday patient was at her baseline. She normally eats a small amount of rice and drinks a small amount of liquid with meals. She has not had any dysphagia or n/v prior to presentation. She lives w/ her son and daughter in Sports coach. She spends most of her day in a chair watching tv. She mobilizes short distances in her home w/ a walker. She has a history of long term stomach issues with diarrhea from what sounds like lactose  intolerance according to the daughter in law.    Per chart review her father and one other family member had Pancreatitic Cancer. There is also a family hx of lung cancer. She has had a prior appendectomy and c-section.   ROS: Review of Systems  Constitutional:  Positive for diaphoresis and malaise/fatigue. Negative for chills and fever.  Gastrointestinal:  Positive for abdominal pain, diarrhea and melena. Negative for nausea and vomiting.  All other systems reviewed and are negative.  Family History  Problem Relation Age of Onset   Cancer Father        Gallbladder   Pancreatic cancer Father    Cancer Sister        Gallbladder    Past Medical History:  Diagnosis Date   Arthritis    Asthma    Hypothyroidism     Past Surgical History:  Procedure Laterality Date   APPENDECTOMY     CESAREAN SECTION     EYE SURGERY     SPINE SURGERY      Social History:  reports that she has never smoked. She has never used smokeless tobacco. She reports that she does not drink alcohol and does not use drugs. No alcohol use No illicit drug use No tobacco use  Allergies: No Known Allergies  Medications Prior to Admission  Medication Sig Dispense Refill   aspirin 81 MG chewable tablet Chew by mouth daily.     levothyroxine (SYNTHROID) 50 MCG tablet TAKE 1  TABLET BY MOUTH DAILY BEFORE BREAKFAST (Patient taking differently: Take 50 mcg by mouth daily before breakfast.) 90 tablet 0   Multiple Vitamins-Minerals (CENTRUM SILVER PO) Take 1 tablet by mouth daily.     Omega-3 Fatty Acids (OMEGA-3 PO) Take 1 capsule by mouth daily.     pantoprazole (PROTONIX) 40 MG tablet Take 40 mg by mouth daily.     polyvinyl alcohol (LIQUIFILM TEARS) 1.4 % ophthalmic solution Place 1 drop into both eyes as needed for dry eyes.     traZODone (DESYREL) 50 MG tablet TAKE 1 TABLET BY MOUTH EVERYDAY AT BEDTIME (Patient taking differently: Take 50 mg by mouth at bedtime.) 90 tablet 0   glycopyrrolate (ROBINUL) 1 MG  tablet Take 1 tablet (1 mg total) by mouth 2 (two) times daily. (Patient not taking: Reported on 08/27/2020) 180 tablet 0     Physical Exam: Blood pressure (!) 106/45, pulse (!) 55, temperature 98.4 F (36.9 C), resp. rate 18, height 4\' 9"  (1.448 m), weight 38.5 kg, SpO2 97 %. General: Frail appearing, elderly female who is laying in bed in NAD HEENT: head is normocephalic, atraumatic.  Sclera are noninjected.  PERRL.  Ears and nose without any masses or lesions.  Mouth is pink and moist. Heart: regular, rate, and rhythm.  Normal s1,s2. No obvious murmurs, gallops, or rubs noted.  Palpable pedal pulses bilaterally  Lungs: CTAB, no wheezes, rhonchi, or rales noted.  Respiratory effort nonlabored Abd: Soft, NT/ND, +BS, no masses, hernias, or organomegaly MS: all 4 extremities are symmetrical with no cyanosis, clubbing, or edema. Skin: warm and dry with no masses, lesions, or rashes Psych: unable as patient doesn't speak [place].  She does however answer all of her DIL's questions appropriately according to the daughter. Neuro: cranial nerves grossly intact.  Sensation normal throughout   Results for orders placed or performed during the hospital encounter of 08/27/20 (from the past 48 hour(s))  Lipase, blood     Status: None   Collection Time: 08/27/20  7:05 AM  Result Value Ref Range   Lipase 50 11 - 51 U/L    Comment: Performed at [facility], [place] [address] [place], [place] [address]  Comprehensive metabolic panel     Status: Abnormal   Collection Time: 08/27/20  7:05 AM  Result Value Ref Range   Sodium 139 135 - 145 mmol/L   Potassium 5.2 (H) 3.5 - 5.1 mmol/L   Chloride 108 98 - 111 mmol/L   CO2 25 22 - 32 mmol/L   Glucose, Bld 171 (H) 70 - 99 mg/dL    Comment: Glucose reference range applies only to samples taken after fasting for at least 8 hours.   BUN 46 (H) 8 - 23 mg/dL   Creatinine, Ser 0.59 0.44 - 1.00 mg/dL   Calcium 8.4 (L) 8.9 - 10.3 mg/dL   Total  Protein 5.4 (L) 6.5 - 8.1 g/dL   Albumin 2.9 (L) 3.5 - 5.0 g/dL   AST 16 15 - 41 U/L   ALT 10 0 - 44 U/L   Alkaline Phosphatase 41 38 - 126 U/L   Total Bilirubin 0.4 0.3 - 1.2 mg/dL   GFR, Estimated >60 >60 mL/min    Comment: (NOTE) Calculated using the CKD-EPI Creatinine Equation (2021)    Anion gap 6 5 - 15    Comment: Performed at [facility], [place] [address], [place], [place] [address]  CBC     Status: Abnormal   Collection Time: 08/27/20  7:05 AM  Result Value Ref Range   WBC 9.1 4.0 - 10.5 K/uL   RBC 2.40 (L) 3.87 - 5.11 MIL/uL   Hemoglobin 7.5 (L) 12.0 - 15.0 g/dL   HCT 23.3 (L) 36.0 - 46.0 %   MCV 97.1 80.0 - 100.0 fL   MCH 31.3 26.0 - 34.0 pg   MCHC 32.2 30.0 - 36.0 g/dL   RDW 13.0 11.5 - 15.5 %   Platelets 221 150 - 400 K/uL   nRBC 0.0 0.0 - 0.2 %    Comment: Performed at [facility], [place] [address], [place], [place] [address]  Troponin I (High Sensitivity)     Status: Abnormal   Collection Time: 08/27/20  7:05 AM  Result Value Ref Range   Troponin I (High Sensitivity) 37 (H) <18 ng/L    Comment: (NOTE) Elevated high sensitivity troponin I (hsTnI) values and significant  changes across serial measurements may suggest ACS but many other  chronic and acute conditions are known to elevate hsTnI results.  Refer to the "Links" section for chest pain algorithms and additional  guidance. Performed at [facility], [place] [address], [place], [place] [address]   ABO/Rh     Status: None   Collection Time: 08/27/20  7:05 AM  Result Value Ref Range   ABO/RH(D)      A POS Performed at [facility], [place] [address] [place], [place] [address]   Resp Panel by RT-PCR (Flu A&B, Covid) Nasopharyngeal Swab     Status: None   Collection Time: 08/27/20  7:56 AM   Specimen: Nasopharyngeal Swab; Nasopharyngeal(NP) swabs in vial transport medium  Result Value Ref Range   SARS Coronavirus 2 by RT PCR NEGATIVE  NEGATIVE    Comment: (NOTE) SARS-CoV-2 target nucleic acids are NOT DETECTED.  The SARS-CoV-2 RNA is generally detectable in upper respiratory specimens during the acute phase of infection. The lowest concentration of SARS-CoV-2 viral copies this assay can detect is 138 copies/mL. A negative result does not preclude SARS-Cov-2 infection and should not be used as the sole basis for treatment or other patient management decisions. A negative result may occur with  improper specimen collection/handling, submission of specimen other than nasopharyngeal swab, presence of viral mutation(s) within the areas targeted by this assay, and inadequate number of viral copies(<138 copies/mL). A negative result must be combined with clinical observations, patient history, and epidemiological information. The expected result is Negative.  Fact Sheet for Patients:  EntrepreneurPulse.com.au  Fact Sheet for Healthcare Providers:  IncredibleEmployment.be  This test is no t yet approved or cleared by the [place] FDA and  has been authorized for detection and/or diagnosis of SARS-CoV-2 by FDA under an Emergency Use Authorization (EUA). This EUA will remain  in effect (meaning this test can be used) for the duration of the COVID-19 declaration under Section 564(b)(1) of the Act, 21 U.S.C.section 360bbb-3(b)(1), unless the authorization is terminated  or revoked sooner.       Influenza A by PCR NEGATIVE NEGATIVE   Influenza B by PCR NEGATIVE NEGATIVE    Comment: (NOTE) The Xpert Xpress SARS-CoV-2/FLU/RSV plus assay is intended as an aid in the diagnosis of influenza from Nasopharyngeal swab specimens and should not be used as a sole basis for treatment. Nasal washings and aspirates are unacceptable for Xpert Xpress SARS-CoV-2/FLU/RSV testing.  Fact Sheet for Patients: EntrepreneurPulse.com.au  Fact Sheet for Healthcare  Providers: IncredibleEmployment.be  This test is not yet approved or cleared by  the [employer] and has been authorized for detection and/or diagnosis of SARS-CoV-2 by FDA under an Emergency Use Authorization (EUA). This EUA will remain in effect (meaning this test can be used) for the duration of the COVID-19 declaration under Section 564(b)(1) of the Act, 21 U.S.C. section 360bbb-3(b)(1), unless the authorization is terminated or revoked.  Performed at [facility], [place] [address] [place], [place] [address]   Troponin I (High Sensitivity)     Status: Abnormal   Collection Time: 08/27/20  9:20 AM  Result Value Ref Range   Troponin I (High Sensitivity) 65 (H) <18 ng/L    Comment: DELTA CHECK NOTED (NOTE) Elevated high sensitivity troponin I (hsTnI) values and significant  changes across serial measurements may suggest ACS but many other  chronic and acute conditions are known to elevate hsTnI results.  Refer to the Links section for chest pain algorithms and additional  guidance. Performed at [facility], [place] [address] [place], [place] [address]   Urinalysis, Routine w reflex microscopic Urine, Random     Status: Abnormal   Collection Time: 08/27/20 12:00 PM  Result Value Ref Range   Color, Urine YELLOW YELLOW   APPearance CLEAR CLEAR   Specific Gravity, Urine 1.023 1.005 - 1.030   pH 7.0 5.0 - 8.0   Glucose, UA NEGATIVE NEGATIVE mg/dL   Hgb urine dipstick LARGE (A) NEGATIVE   Bilirubin Urine NEGATIVE NEGATIVE   Ketones, ur NEGATIVE NEGATIVE mg/dL   Protein, ur NEGATIVE NEGATIVE mg/dL   Nitrite NEGATIVE NEGATIVE   Leukocytes,Ua NEGATIVE NEGATIVE   RBC / HPF 11-20 0 - 5 RBC/hpf   WBC, UA 0-5 0 - 5 WBC/hpf   Bacteria, UA RARE (A) NONE SEEN   Squamous Epithelial / LPF 0-5 0 - 5    Comment: Performed at [facility] [place], [place] [address], [place], [place] [address]  Type and screen     Status:  None (Preliminary result)   Collection Time: 08/27/20 12:02 PM  Result Value Ref Range   ABO/RH(D) A POS    Antibody Screen NEG    Sample Expiration 08/30/2020,2359    Unit Number [id number]    Blood Component Type RBC LR PHER2    Unit division 00    Status of Unit ISSUED,FINAL    Transfusion Status OK TO TRANSFUSE    Crossmatch Result Compatible    Unit Number [id number]    Blood Component Type RBC LR PHER1    Unit division 00    Status of Unit ISSUED,FINAL    Transfusion Status OK TO TRANSFUSE    Crossmatch Result      Compatible Performed at [facility], [place] [address] [place],  [address]    Unit Number [id number]    Blood Component Type RED CELLS,LR    Unit division 00    Status of Unit ALLOCATED    Transfusion Status OK TO TRANSFUSE    Crossmatch Result Compatible    Unit Number [id number]    Blood Component Type RED CELLS,LR    Unit division 00    Status of Unit ALLOCATED    Transfusion Status OK TO TRANSFUSE    Crossmatch Result Compatible   Prepare RBC (crossmatch)     Status: None   Collection Time: 08/27/20 12:02 PM  Result Value Ref Range   Order Confirmation      ORDER PROCESSED BY BLOOD BANK Performed at [facility], [place] [address] [place],  [place] [address]   Prepare RBC (crossmatch)     Status: None   Collection Time: 08/27/20  4:48 PM  Result Value Ref Range   Order Confirmation      ORDER PROCESSED BY BLOOD BANK Performed at [facility], [place] [address] [place], [place] [address]   CEA     Status: Abnormal   Collection Time: 08/27/20  4:57 PM  Result Value Ref Range   CEA 6.5 (H) 0.0 - 4.7 ng/mL    Comment: (NOTE)                             Nonsmokers          <3.9                             Smokers             <5.6 Roche [employer]ostics Electrochemiluminescence Immunoassay (ECLIA) Values obtained with different assay methods or kits cannot be used interchangeably.   Results cannot be interpreted as absolute evidence of the presence or absence of malignant disease. Performed At: [facility] [place], [place] [id number] Rush Farmer MD SH:[id number]   Cancer antigen 19-9     Status: Abnormal   Collection Time: 08/27/20  4:57 PM  Result Value Ref Range   CA 19-9 178 (H) 0 - 35 U/mL    Comment: (NOTE) Roche [employer]ostics Electrochemiluminescence Immunoassay (ECLIA) Values obtained with different assay methods or kits cannot be used interchangeably.  Results cannot be interpreted as absolute evidence of the presence or absence of malignant disease. Performed At: [facility] [place], [place] [id number] Rush Farmer MD NO:[id number]   Hemoglobin and hematocrit, blood     Status: Abnormal   Collection Time: 08/27/20  5:57 PM  Result Value Ref Range   Hemoglobin 9.3 (L) 12.0 - 15.0 g/dL   HCT 28.6 (L) 36.0 - 46.0 %    Comment: Performed at [facility], [place] [address], [place], [place] [address]  Comprehensive metabolic panel     Status: Abnormal   Collection Time: 08/28/20 12:32 AM  Result Value Ref Range   Sodium 137 135 - 145 mmol/L   Potassium 3.7 3.5 - 5.1 mmol/L    Comment: DELTA CHECK NOTED   Chloride 108 98 - 111 mmol/L   CO2 26 22 - 32 mmol/L   Glucose, Bld 82 70 - 99 mg/dL    Comment: Glucose reference range applies only to samples taken after fasting for at least 8 hours.   BUN 17 8 - 23 mg/dL   Creatinine, Ser 0.50 0.44 - 1.00 mg/dL   Calcium 7.7 (L) 8.9 - 10.3 mg/dL   Total Protein 4.6 (L) 6.5 - 8.1 g/dL   Albumin 2.5 (L) 3.5 - 5.0 g/dL   AST 20 15 - 41 U/L   ALT 12 0 - 44 U/L   Alkaline Phosphatase 35 (L) 38 - 126 U/L   Total Bilirubin 0.8 0.3 - 1.2 mg/dL   GFR, Estimated >60 >60 mL/min    Comment: (NOTE) Calculated using the CKD-EPI Creatinine Equation (2021)    Anion gap 3 (L) 5 - 15    Comment: Performed at [facility], [place]  [address], [place],  [address]  CBC     Status: Abnormal   Collection Time: 08/28/20 12:32 AM  Result Value  Ref Range   WBC 5.9 4.0 - 10.5 K/uL   RBC 2.85 (L) 3.87 - 5.11 MIL/uL   Hemoglobin 8.6 (L) 12.0 - 15.0 g/dL   HCT 25.6 (L) 36.0 - 46.0 %   MCV 89.8 80.0 - 100.0 fL    Comment: REPEATED TO VERIFY DELTA CHECK NOTED    MCH 30.2 26.0 - 34.0 pg   MCHC 33.6 30.0 - 36.0 g/dL   RDW 15.5 11.5 - 15.5 %   Platelets 136 (L) 150 - 400 K/uL    Comment: SPECIMEN CHECKED FOR CLOTS CONSISTENT WITH PREVIOUS RESULT REPEATED TO VERIFY    nRBC 0.0 0.0 - 0.2 %    Comment: Performed at [facility], [place] [address], [place], [place] [address]   DG Chest 2 View  Result Date: 08/27/2020 CLINICAL DATA:  Shortness of breath this morning EXAM: CHEST - 2 VIEW COMPARISON:  None. FINDINGS: Borderline heart size. Negative aortic and hilar contours. There is no edema, consolidation, effusion, or pneumothorax. Multiple remote compression fractures in the upper lumbar and thoracic spine with 2 level cement augmentation. Pronounced osteopenia. IMPRESSION: No evidence of acute cardiopulmonary disease. Electronically Signed   By: Monte Fantasia M.D.   On: 08/27/2020 07:12   CT Abdomen Pelvis W Contrast  Result Date: 08/27/2020 CLINICAL DATA:  Abdominal distension. Abdominal pain and diarrhea for 3 days EXAM: CT ABDOMEN AND PELVIS WITH CONTRAST TECHNIQUE: Multidetector CT imaging of the abdomen and pelvis was performed using the standard protocol following bolus administration of intravenous contrast. CONTRAST:  67mL OMNIPAQUE IOHEXOL 300 MG/ML  SOLN COMPARISON:  None. FINDINGS: Lower chest: Borderline heart size. Extensive aortic and coronary atherosclerosis. Bands of scarring in the bilateral bases. Hepatobiliary: Cholecystectomy which likely accounts for mild intrahepatic biliary dilatation given normal biliary labs.Simple cyst in the caudate lobe measuring 15 mm. Pancreas: Subcentimeter  simple cyst in the uncinate process and head without ductal dilatation, considered incidental and non worrisome given patient age. Spleen: Unremarkable. Adrenals/Urinary Tract: Negative adrenals. No hydronephrosis or stone. Unremarkable bladder. Stomach/Bowel: Numerous colonic diverticula. Colonic fluid levels are seen to the descending segment diffuse bowel gas which may be ingested. Oral contrast reaches the proximal colon. Vascular/Lymphatic: No acute vascular abnormality. Atheromatous calcification of the aorta and iliacs which is extensive. The aorta is tortuous with aneurysmal enlargement below the renal arteries up to 4.1 Cm. Recommend follow-up every 12 months and vascular consultation. This recommendation follows ACR consensus guidelines: White Paper of the ACR Incidental Findings Committee II on Vascular Findings. J Am Coll Radiol 2013; 10:789-794. No mass or adenopathy. Reproductive:Unremarkable for age Other: No ascites or pneumoperitoneum. Musculoskeletal: Generalized osteopenia. Remote compression fractures of T12-L3 with cement augmentation at T12 and L1. No acute osseous finding IMPRESSION: 1. Diffuse small bowel gas without obstruction or visible inflammation. 2. Extensive atherosclerosis including the coronary arteries. Fusiform abdominal aortic aneurysm measuring up to 4.1 cm. If appropriate for comorbidities, follow-up recommendations are above. 3. Left colonic diverticula. Electronically Signed   By: Monte Fantasia M.D.   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 female, who is actually 87 yo according to the DIL, who presented on 6/13 with melanotic diarrhea. She underwent EGD which revealed the above findings.  She is currently not having any obstructive symptoms.  A CA 19-9 is 178 and CEA 6.5. She does have strong family hx  of cancer including 2 w/ pancreatitic  cancer and 1 w/ lung cancer. No current indication for emergency surgery today.  She has had another melanic stool last night.  Her hgb went from 9 to 8 after transfusion.  Will continue to monitor this.  If her pathology comes back as benign it is possible, we could discuss with IR regarding an embolization of her GDA to prevent further bleeding.  Her daughter in law was not keen on the idea of any surgery due to age and frail state.  The ultimate treatment if there were a malignancy would be a whipple, but given the above, this would likely not be offered nor do I think the family would agree to this.  There are other options such as resection and or bypass.  Once again, my concern with any of this is the patient's tolerance of any surgery given her baseline malnutrition and minimal activity.  Again, family did not seem too interested in this idea.  We discussed that we would wait for her pathology and then we can have more pointed discussions on where we go from here.  Palliative care services would like be beneficial at that time too.  FEN - CLD VTE - SCDs ID - None indicated from a surgical standpoint  Hypothyroidism Asthma  Henreitta Cea, [facility] Surgery 08/28/2020, 9:21 AM Please see Amion for pager number during day hours 7:00am-4:30pm

## 2020-08-28 NOTE — Progress Notes (Signed)
Patient had a medium sized, dark cherry colored, watery stool X1 this shift. She complained of abdominal pain and received Morphine 2mg  IV at 2037 and patient later reported relief from pain. She was able to sleep comfortably with no signs of distress or discomfort. Daughter is currently sitting by patient's bedside.

## 2020-08-28 NOTE — Progress Notes (Signed)
[facility] Gastroenterology Progress Note  Dwan Fennel 87 y.o. 10-Feb-1931  CC:  Duodenal mass  Subjective: Patient had epigastric pain overnight, currently has no pain.  Had a bloody BM yesterday but none thus far today. Denies nausea/vomiting.  ROS : Review of Systems  Cardiovascular:  Negative for chest pain and palpitations.  Gastrointestinal:  Positive for abdominal pain, blood in stool and melena. Negative for constipation, diarrhea, heartburn, nausea and vomiting.     Objective: Vital signs in last 24 hours: Vitals:   08/28/20 0229 08/28/20 0446  BP: (!) 104/47 (!) 106/45  Pulse: (!) 58 (!) 55  Resp:  18  Temp:  98.4 F (36.9 C)  SpO2:  97%    Physical Exam:  General:  Lethargic, resting in bed, no acute distress  Head:  Normocephalic, without obvious abnormality, atraumatic  Eyes:  Conjunctival pallor, EOMs intact  Lungs:   Clear to auscultation bilaterally, respirations unlabored  Heart:  Regular rate and rhythm, S1, S2 normal  Abdomen:   Soft with mild epigastric tenderness, normoactive bowel sounds  Extremities: Extremities normal, atraumatic, no  edema    Lab Results: Recent Labs    08/27/20 0705 08/28/20 0032  NA 139 137  K 5.2* 3.7  CL 108 108  CO2 25 26  GLUCOSE 171* 82  BUN 46* 17  CREATININE 0.59 0.50  CALCIUM 8.4* 7.7*   Recent Labs    08/27/20 0705 08/28/20 0032  AST 16 20  ALT 10 12  ALKPHOS 41 35*  BILITOT 0.4 0.8  PROT 5.4* 4.6*  ALBUMIN 2.9* 2.5*   Recent Labs    08/27/20 0705 08/27/20 1757 08/28/20 0032  WBC 9.1  --  5.9  HGB 7.5* 9.3* 8.6*  HCT 23.3* 28.6* 25.6*  MCV 97.1  --  89.8  PLT 221  --  136*   No results for input(s): LABPROT, INR in the last 72 hours.    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  Normal Cr (0.50) -Hgb 8.6, decreased slightly from 9.3 post-transfusion Hgb yesterday -Ca 19-9  elevated to 178; CEA elevated to 6.5  Plan: Await pathology. Surgical team consulted, awaiting recommendations.  Appreciate their assistance.   Continue Protonix IV drip for a total of 72 hours, then transition to BID dosing.   Continue to monitor H&H with transfusion as needed to maintain Hgb >7-8.     [facility] will follow.   Salley Slaughter PA-C 08/28/2020, 8:38 AM  Contact #  [phone]

## 2020-08-28 NOTE — Transfer of Care (Signed)
Immediate Anesthesia Transfer of Care Note  Patient: Kendra Dickson  Procedure(s) Performed: ESOPHAGOGASTRODUODENOSCOPY (EGD) WITH PROPOFOL BIOPSY  Patient Location: PACU  Anesthesia Type:MAC  Level of Consciousness: awake and patient cooperative  Airway & Oxygen Therapy: Patient Spontanous Breathing and Patient connected to face mask oxygen  Post-op Assessment: Report given to RN, Post -op Vital signs reviewed and stable and Patient moving all extremities X 4  Post vital signs: stable  Last Vitals:  Vitals Value Taken Time  BP 106/45 08/28/20 0446  Temp 36.9 C 08/28/20 0446  Pulse 55 08/28/20 0446  Resp 13 08/28/20 0817  SpO2 97 % 08/28/20 0446  Vitals shown include unvalidated device data.  Last Pain:  Vitals:   08/27/20 2107  TempSrc:   PainSc: Asleep      Patients Stated Pain Goal: 0 (14/15/97 3312)  Complications: No notable events documented.

## 2020-08-28 NOTE — Progress Notes (Addendum)
Received a called from [place] from pathology.  Biopsy shows invasive adenocarcinoma.  Will notify her hospitalist to consult oncology/palliative care.  Ronnette Juniper, MD

## 2020-08-28 NOTE — Progress Notes (Signed)
Received a call from bedside RN due to hypotension with MAP 58.  Patient is on maintenance IV fluid LR at 125 cc/hr and on Protonix drip.  Added Midodrine 10 mg TID x 3 doses to maintain MAP>65.  We will continue to closely monitor and treat as indicated.

## 2020-08-28 NOTE — Progress Notes (Addendum)
PROGRESS NOTE    Kendra Dickson  INO:[id number] DOB: 02-Dec-1930 DOA: 08/27/2020 PCP: Rudene Anda, MD   Chief Complain: Diarrhea, weakness  Brief Narrative: Patient is a 87 year old female with history of chronic diarrhea, chronic abdominal pain, hypothyroidism, GERD who presented with diarrhea and weakness from home.  She speaks [place].  As per the daughter at the bedside, she has issues with her abdomen since several years.  Patient reported epigastric pain on presentation.  There was also report of darker stool for last 10 days.  Patient does not take NSAIDs or aspirin.  On presentation her hemoglobin was in the range of 8.  FOBT was positive.  She was admitted for the management of GI bleed.  GI consulted and she underwent EGD with finding of fungating, infiltrative ulcerated mass in the duodenum.  Currently on Protonix drip.  General surgery consulted for surgical options.  Pathology showed invasive adenocarcinoma.  Discussion being held with family about hospice options.  Palliative care consulted  Assessment & Plan:   Active Problems:   Symptomatic anemia   Upper GI bleed/symptomatic anemia: Report of dark stools at home.  History of chronic abdominal pain, diarrhea.  Hemoglobin was in the range of 7 on presentation.  She was transfused with 2 units of PRBC with improvement in the hemoglobin of 9.3.  Hemoglobin in the range of 8 today. We will continue to monitor H&H, transfuse if needed.  Duodenal mass: GI consulted and she underwent EGD with finding of fungating, infiltrative ulcerated mass in the duodenum. Elevated CEA,CA- 19-9 . Currently on Protonix drip.  General surgery consulted for surgical options.  Biopsy revealed invasive adenocarcinoma.  Patient is a poor candidate for any surgical options/poor candidate for treatment for the cancer .  We will continue to discuss goals of care with the family.  We will involve palliative care .  Daughter is interested to know about residential  hospice versus hospice at home.  TOC consulted.  She can only tolerate clear liquid diet.  She has very poor oral intake.  Hyperkalemia: Resolved  Thrombocytopenia: Mild.  Continue to monitor  Hypothyroidism: On Synthyroid.  Elevated troponin: Mild elevated troponin, no chest pain.  EKG did not show any ischemic changes.  Most likely associated with supply demand ischemia from type II MI secondary to anemia.  No need of further work-up  History of chronic diarrhea/pancreatic insufficiency: Follows with gastroenterology at [facility], Dr. Earlean Shawl.   Severe protein calorie malnutrition/failure to thrive: BMI of 18.3.  We will request for nutrition evaluation.  Most likely associated with malignancy and poor appetite.           DVT prophylaxis: SCD Code Status: Full code Family Communication: Daughter at the bedside Status is: Inpatient  Dispo: The patient is from: Home              Anticipated d/c is to: Home              Patient currently is not medically stable to d/c.   Difficult to place patient No     Consultants: GI,surgery   Procedures:EgD  Antimicrobials:  Anti-infectives (From admission, onward)    None       Subjective: Patient seen and examined at the bedside this morning.  Hemodynamically stable during my evaluation.  Patient does not display [place].  She does not look like in distress.,  Overall appears comfortable.  Very malnourished, cachectic.  Daughter at the bedside to help with the translation.  Objective:  Vitals:   08/28/20 0058 08/28/20 0229 08/28/20 0446 08/28/20 1149  BP: (!) 87/47 (!) 104/47 (!) 106/45 (!) 124/46  Pulse: 62 (!) 58 (!) 55 (!) 51  Resp: 16  18   Temp: 98.2 F (36.8 C)  98.4 F (36.9 C)   TempSrc:      SpO2: 98%  97% 97%  Weight:      Height:        Intake/Output Summary (Last 24 hours) at 08/28/2020 1152 Last data filed at 08/28/2020 0500 Gross per 24 hour  Intake 975 ml  Output 500 ml  Net 475 ml   Filed  Weights   08/27/20 1736  Weight: 38.5 kg    Examination:  General exam: Appears calm and comfortable ,Not in distress, malnourished, cachectic HEENT:PERRL,Oral mucosa moist, Ear/Nose normal on gross exam Respiratory system: Bilateral equal air entry, normal vesicular breath sounds, no wheezes or crackles  Cardiovascular system: S1 & S2 heard, RRR. No JVD, murmurs, rubs, gallops or clicks. No pedal edema. Gastrointestinal system: Abdomen is nondistended, soft and nontender. No organomegaly or masses felt. Normal bowel sounds heard. Central nervous system: Alert and oriented. No focal neurological deficits. Extremities: No edema, no clubbing ,no cyanosisSkin: No rashes, lesions or ulcers,no icterus ,no pallor   Data Reviewed: I have personally reviewed following labs and imaging studies  CBC: Recent Labs  Lab 08/27/20 0705 08/27/20 1757 08/28/20 0032  WBC 9.1  --  5.9  HGB 7.5* 9.3* 8.6*  HCT 23.3* 28.6* 25.6*  MCV 97.1  --  89.8  PLT 221  --  371*   Basic Metabolic Panel: Recent Labs  Lab 08/27/20 0705 08/28/20 0032  NA 139 137  K 5.2* 3.7  CL 108 108  CO2 25 26  GLUCOSE 171* 82  BUN 46* 17  CREATININE 0.59 0.50  CALCIUM 8.4* 7.7*   GFR: Estimated Creatinine Clearance: 29.5 mL/min (by C-G formula based on SCr of 0.5 mg/dL). Liver Function Tests: Recent Labs  Lab 08/27/20 0705 08/28/20 0032  AST 16 20  ALT 10 12  ALKPHOS 41 35*  BILITOT 0.4 0.8  PROT 5.4* 4.6*  ALBUMIN 2.9* 2.5*   Recent Labs  Lab 08/27/20 0705  LIPASE 50   No results for input(s): AMMONIA in the last 168 hours. Coagulation Profile: No results for input(s): INR, PROTIME in the last 168 hours. Cardiac Enzymes: No results for input(s): CKTOTAL, CKMB, CKMBINDEX, TROPONINI in the last 168 hours. BNP (last 3 results) No results for input(s): PROBNP in the last 8760 hours. HbA1C: No results for input(s): HGBA1C in the last 72 hours. CBG: No results for input(s): GLUCAP in the last 168  hours. Lipid Profile: No results for input(s): CHOL, HDL, LDLCALC, TRIG, CHOLHDL, LDLDIRECT in the last 72 hours. Thyroid Function Tests: No results for input(s): TSH, T4TOTAL, FREET4, T3FREE, THYROIDAB in the last 72 hours. Anemia Panel: No results for input(s): VITAMINB12, FOLATE, FERRITIN, TIBC, IRON, RETICCTPCT in the last 72 hours. Sepsis Labs: No results for input(s): PROCALCITON, LATICACIDVEN in the last 168 hours.  Recent Results (from the past 240 hour(s))  Resp Panel by RT-PCR (Flu A&B, Covid) Nasopharyngeal Swab     Status: None   Collection Time: 08/27/20  7:56 AM   Specimen: Nasopharyngeal Swab; Nasopharyngeal(NP) swabs in vial transport medium  Result Value Ref Range Status   SARS Coronavirus 2 by RT PCR NEGATIVE NEGATIVE Final    Comment: (NOTE) SARS-CoV-2 target nucleic acids are NOT DETECTED.  The SARS-CoV-2 RNA is generally detectable  in upper respiratory specimens during the acute phase of infection. The lowest concentration of SARS-CoV-2 viral copies this assay can detect is 138 copies/mL. A negative result does not preclude SARS-Cov-2 infection and should not be used as the sole basis for treatment or other patient management decisions. A negative result may occur with  improper specimen collection/handling, submission of specimen other than nasopharyngeal swab, presence of viral mutation(s) within the areas targeted by this assay, and inadequate number of viral copies(<138 copies/mL). A negative result must be combined with clinical observations, patient history, and epidemiological information. The expected result is Negative.  Fact Sheet for Patients:  EntrepreneurPulse.com.au  Fact Sheet for Healthcare Providers:  IncredibleEmployment.be  This test is no t yet approved or cleared by the [place] FDA and  has been authorized for detection and/or diagnosis of SARS-CoV-2 by FDA under an Emergency Use Authorization  (EUA). This EUA will remain  in effect (meaning this test can be used) for the duration of the COVID-19 declaration under Section 564(b)(1) of the Act, 21 U.S.C.section 360bbb-3(b)(1), unless the authorization is terminated  or revoked sooner.       Influenza A by PCR NEGATIVE NEGATIVE Final   Influenza B by PCR NEGATIVE NEGATIVE Final    Comment: (NOTE) The Xpert Xpress SARS-CoV-2/FLU/RSV plus assay is intended as an aid in the diagnosis of influenza from Nasopharyngeal swab specimens and should not be used as a sole basis for treatment. Nasal washings and aspirates are unacceptable for Xpert Xpress SARS-CoV-2/FLU/RSV testing.  Fact Sheet for Patients: EntrepreneurPulse.com.au  Fact Sheet for Healthcare Providers: IncredibleEmployment.be  This test is not yet approved or cleared by the [place] FDA and has been authorized for detection and/or diagnosis of SARS-CoV-2 by FDA under an Emergency Use Authorization (EUA). This EUA will remain in effect (meaning this test can be used) for the duration of the COVID-19 declaration under Section 564(b)(1) of the Act, 21 U.S.C. section 360bbb-3(b)(1), unless the authorization is terminated or revoked.  Performed at [facility], [place] [address] [place], [place] [address]          Radiology Studies: DG Chest 2 View  Result Date: 08/27/2020 CLINICAL DATA:  Shortness of breath this morning EXAM: CHEST - 2 VIEW COMPARISON:  None. FINDINGS: Borderline heart size. Negative aortic and hilar contours. There is no edema, consolidation, effusion, or pneumothorax. Multiple remote compression fractures in the upper lumbar and thoracic spine with 2 level cement augmentation. Pronounced osteopenia. IMPRESSION: No evidence of acute cardiopulmonary disease. Electronically Signed   By: Monte Fantasia M.D.   On: 08/27/2020 07:12   CT Abdomen Pelvis W Contrast  Result Date:  08/27/2020 CLINICAL DATA:  Abdominal distension. Abdominal pain and diarrhea for 3 days EXAM: CT ABDOMEN AND PELVIS WITH CONTRAST TECHNIQUE: Multidetector CT imaging of the abdomen and pelvis was performed using the standard protocol following bolus administration of intravenous contrast. CONTRAST:  76mL OMNIPAQUE IOHEXOL 300 MG/ML  SOLN COMPARISON:  None. FINDINGS: Lower chest: Borderline heart size. Extensive aortic and coronary atherosclerosis. Bands of scarring in the bilateral bases. Hepatobiliary: Cholecystectomy which likely accounts for mild intrahepatic biliary dilatation given normal biliary labs.Simple cyst in the caudate lobe measuring 15 mm. Pancreas: Subcentimeter simple cyst in the uncinate process and head without ductal dilatation, considered incidental and non worrisome given patient age. Spleen: Unremarkable. Adrenals/Urinary Tract: Negative adrenals. No hydronephrosis or stone. Unremarkable bladder. Stomach/Bowel: Numerous colonic diverticula. Colonic fluid levels are seen to the descending segment diffuse bowel gas which may  be ingested. Oral contrast reaches the proximal colon. Vascular/Lymphatic: No acute vascular abnormality. Atheromatous calcification of the aorta and iliacs which is extensive. The aorta is tortuous with aneurysmal enlargement below the renal arteries up to 4.1 Cm. Recommend follow-up every 12 months and vascular consultation. This recommendation follows ACR consensus guidelines: White Paper of the ACR Incidental Findings Committee II on Vascular Findings. J Am Coll Radiol 2013; 10:789-794. No mass or adenopathy. Reproductive:Unremarkable for age Other: No ascites or pneumoperitoneum. Musculoskeletal: Generalized osteopenia. Remote compression fractures of T12-L3 with cement augmentation at T12 and L1. No acute osseous finding IMPRESSION: 1. Diffuse small bowel gas without obstruction or visible inflammation. 2. Extensive atherosclerosis including the coronary arteries.  Fusiform abdominal aortic aneurysm measuring up to 4.1 cm. If appropriate for comorbidities, follow-up recommendations are above. 3. Left colonic diverticula. Electronically Signed   By: Monte Fantasia M.D.   On: 08/27/2020 10:48        Scheduled Meds:  feeding supplement  1 Container Oral BID BM   levothyroxine  50 mcg Oral QAC breakfast   midodrine  10 mg Oral TID WC   Continuous Infusions:  lactated ringers 125 mL/hr at 08/28/20 0225   pantoprozole (PROTONIX) infusion 8 mg/hr (08/28/20 1115)     LOS: 0 days    Time spent: 35 mins.More than 50% of that time was spent in counseling and/or coordination of care.      Shelly Coss, MD [facility] P6/14/2022, 11:52 AM

## 2020-08-29 ENCOUNTER — Encounter (HOSPITAL_COMMUNITY): Payer: Self-pay | Admitting: Gastroenterology

## 2020-08-29 DIAGNOSIS — Z7189 Other specified counseling: Secondary | ICD-10-CM

## 2020-08-29 DIAGNOSIS — Z515 Encounter for palliative care: Secondary | ICD-10-CM

## 2020-08-29 DIAGNOSIS — K922 Gastrointestinal hemorrhage, unspecified: Secondary | ICD-10-CM

## 2020-08-29 DIAGNOSIS — C801 Malignant (primary) neoplasm, unspecified: Secondary | ICD-10-CM

## 2020-08-29 LAB — CBC WITH DIFFERENTIAL/PLATELET
Abs Immature Granulocytes: 0.02 10*3/uL (ref 0.00–0.07)
Basophils Absolute: 0 10*3/uL (ref 0.0–0.1)
Basophils Relative: 1 %
Eosinophils Absolute: 0.1 10*3/uL (ref 0.0–0.5)
Eosinophils Relative: 2 %
HCT: 28.3 % — ABNORMAL LOW (ref 36.0–46.0)
Hemoglobin: 9.3 g/dL — ABNORMAL LOW (ref 12.0–15.0)
Immature Granulocytes: 0 %
Lymphocytes Relative: 26 %
Lymphs Abs: 1.6 10*3/uL (ref 0.7–4.0)
MCH: 29.7 pg (ref 26.0–34.0)
MCHC: 32.9 g/dL (ref 30.0–36.0)
MCV: 90.4 fL (ref 80.0–100.0)
Monocytes Absolute: 0.7 10*3/uL (ref 0.1–1.0)
Monocytes Relative: 11 %
Neutro Abs: 3.7 10*3/uL (ref 1.7–7.7)
Neutrophils Relative %: 60 %
Platelets: 152 10*3/uL (ref 150–400)
RBC: 3.13 MIL/uL — ABNORMAL LOW (ref 3.87–5.11)
RDW: 15.9 % — ABNORMAL HIGH (ref 11.5–15.5)
WBC: 6.1 10*3/uL (ref 4.0–10.5)
nRBC: 0 % (ref 0.0–0.2)

## 2020-08-29 LAB — BASIC METABOLIC PANEL
Anion gap: 5 (ref 5–15)
BUN: 8 mg/dL (ref 8–23)
CO2: 26 mmol/L (ref 22–32)
Calcium: 8.1 mg/dL — ABNORMAL LOW (ref 8.9–10.3)
Chloride: 109 mmol/L (ref 98–111)
Creatinine, Ser: 0.51 mg/dL (ref 0.44–1.00)
GFR, Estimated: >60 mL/min (ref >60)
Glucose, Bld: 101 mg/dL — ABNORMAL HIGH (ref 70–99)
Potassium: 3.7 mmol/L (ref 3.5–5.1)
Sodium: 140 mmol/L (ref 135–145)

## 2020-08-29 NOTE — Progress Notes (Signed)
Patient ID: Kendra Dickson, female   DOB: 1930/08/28, 87 y.o.   MRN: [id number]   Meet with the patient's son with palliative care present and an interpreter.  I explained that she would need a Whipple to remove this mass and we discussed the known difficulties of this procedure which would be much more difficult for her given her age and nutrition status. He will discuss things further with his family

## 2020-08-29 NOTE — Progress Notes (Signed)
PROGRESS NOTE    Kendra Dickson  MCN:[id number] DOB: 09-18-1930 DOA: 08/27/2020 PCP: Rudene Anda, MD   Chief Complain: Diarrhea, weakness  Brief Narrative: Patient is a 87 year old female with history of chronic diarrhea, chronic abdominal pain, hypothyroidism, GERD who presented with diarrhea and weakness from home.  She speaks [place].  As per the daughter at the bedside, she has issues with her abdomen since several years.  Patient reported epigastric pain on presentation.  There was also report of darker stool for last 10 days.  Patient does not take NSAIDs or aspirin.  On presentation her hemoglobin was in the range of 8.  FOBT was positive.  She was admitted for the management of GI bleed.  GI consulted and she underwent EGD with finding of fungating, infiltrative ulcerated mass in the duodenum.  General surgery consulted for surgical options but there is limited choice and she is not an appropriate candidate.  Pathology showed invasive adenocarcinoma.  Discussion being held with family about hospice options.  Palliative care consulted  Assessment & Plan:   Active Problems:   Symptomatic anemia   Upper GI bleed   Upper GI bleed/symptomatic anemia: Report of dark stools at home.  History of chronic abdominal pain, diarrhea.  Hemoglobin was in the range of 7 on presentation.  She was transfused with 2 units of PRBC  Hemoglobin in the range of 9. We will continue to monitor H&H, transfuse if needed. Continue PPI twice daily indefinitely  Duodenal mass: GI consulted and she underwent EGD with finding of fungating, infiltrative ulcerated mass in the duodenum. Elevated CEA,CA- 19-9 . She was on Protonix drip.    Biopsy revealed invasive adenocarcinoma.  Patient is a poor candidate for any surgical options/poor candidate for treatment for the cancer .  We will continue to discuss goals of care with the family.  We have consulted  palliative care .  Son/Daughter in law is interested to know about  residential hospice versus hospice at home.  TOC consulted.  She can only tolerate clear liquid diet.  She has very poor oral intake.  Hyperkalemia: Resolved  Thrombocytopenia: Mild.  Stable  Hypothyroidism: On Synthyroid.  Elevated troponin: Mild elevated troponin, no chest pain.  EKG did not show any ischemic changes.  Most likely associated with supply demand ischemia from type II MI secondary to anemia.  No need of further work-up  History of chronic diarrhea/pancreatic insufficiency: Follows with gastroenterology at [facility], Dr. Earlean Shawl.   Severe protein calorie malnutrition/failure to thrive: BMI of 18.3.  We have requested for nutrition evaluation.  Most likely associated with malignancy and poor appetite.           DVT prophylaxis: SCD Code Status: Full code Family Communication: Son at the bedside Status is: Inpatient  Dispo: The patient is from: Home              Anticipated d/c is to: Home              Patient currently is not medically stable to d/c.   Difficult to place patient No     Consultants: GI,surgery   Procedures:EgD  Antimicrobials:  Anti-infectives (From admission, onward)    None       Subjective: Patient seen and examined at the bedside this morning.  Appears comfortable, hemodynamically stable.  No active bleeding, no active nausea, vomiting or abdominal pain.  Son at the bedside.  We discussed about goals of care, hospice options..  Objective: Vitals:  08/28/20 1149 08/28/20 1820 08/28/20 2015 08/29/20 0503  BP: (!) 124/46 (!) 114/56 (!) 109/50 (!) 134/56  Pulse: (!) 51 63 62 63  Resp:  16 18 16   Temp: 98.1 F (36.7 C) 98.6 F (37 C) 98.1 F (36.7 C) 99.3 F (37.4 C)  TempSrc: Oral Oral Oral Oral  SpO2: 97% 96% 96% 94%  Weight:      Height:        Intake/Output Summary (Last 24 hours) at 08/29/2020 0816 Last data filed at 08/29/2020 0600 Gross per 24 hour  Intake 682 ml  Output 3450 ml  Net -2768 ml   Filed  Weights   08/27/20 1736  Weight: 38.5 kg    Examination:  General exam: Overall comfortable, not in distress, cachectic, frail HEENT: PERRL Respiratory system:  no wheezes or crackles  Cardiovascular system: S1 & S2 heard, RRR.  Gastrointestinal system: Abdomen is nondistended, soft and nontender. Central nervous system: Alert and awake Extremities: No edema, no clubbing ,no cyanosis Skin: No rashes, no ulcers,no icterus     Data Reviewed: I have personally reviewed following labs and imaging studies  CBC: Recent Labs  Lab 08/27/20 0705 08/27/20 1757 08/28/20 0032 08/29/20 0536  WBC 9.1  --  5.9 6.1  NEUTROABS  --   --   --  3.7  HGB 7.5* 9.3* 8.6* 9.3*  HCT 23.3* 28.6* 25.6* 28.3*  MCV 97.1  --  89.8 90.4  PLT 221  --  136* 597   Basic Metabolic Panel: Recent Labs  Lab 08/27/20 0705 08/28/20 0032 08/29/20 0536  NA 139 137 140  K 5.2* 3.7 3.7  CL 108 108 109  CO2 25 26 26   GLUCOSE 171* 82 101*  BUN 46* 17 8  CREATININE 0.59 0.50 0.51  CALCIUM 8.4* 7.7* 8.1*   GFR: Estimated Creatinine Clearance: 29.5 mL/min (by C-G formula based on SCr of 0.51 mg/dL). Liver Function Tests: Recent Labs  Lab 08/27/20 0705 08/28/20 0032  AST 16 20  ALT 10 12  ALKPHOS 41 35*  BILITOT 0.4 0.8  PROT 5.4* 4.6*  ALBUMIN 2.9* 2.5*   Recent Labs  Lab 08/27/20 0705  LIPASE 50   No results for input(s): AMMONIA in the last 168 hours. Coagulation Profile: No results for input(s): INR, PROTIME in the last 168 hours. Cardiac Enzymes: No results for input(s): CKTOTAL, CKMB, CKMBINDEX, TROPONINI in the last 168 hours. BNP (last 3 results) No results for input(s): PROBNP in the last 8760 hours. HbA1C: No results for input(s): HGBA1C in the last 72 hours. CBG: No results for input(s): GLUCAP in the last 168 hours. Lipid Profile: No results for input(s): CHOL, HDL, LDLCALC, TRIG, CHOLHDL, LDLDIRECT in the last 72 hours. Thyroid Function Tests: No results for input(s): TSH,  T4TOTAL, FREET4, T3FREE, THYROIDAB in the last 72 hours. Anemia Panel: No results for input(s): VITAMINB12, FOLATE, FERRITIN, TIBC, IRON, RETICCTPCT in the last 72 hours. Sepsis Labs: No results for input(s): PROCALCITON, LATICACIDVEN in the last 168 hours.  Recent Results (from the past 240 hour(s))  Resp Panel by RT-PCR (Flu A&B, Covid) Nasopharyngeal Swab     Status: None   Collection Time: 08/27/20  7:56 AM   Specimen: Nasopharyngeal Swab; Nasopharyngeal(NP) swabs in vial transport medium  Result Value Ref Range Status   SARS Coronavirus 2 by RT PCR NEGATIVE NEGATIVE Final    Comment: (NOTE) SARS-CoV-2 target nucleic acids are NOT DETECTED.  The SARS-CoV-2 RNA is generally detectable in upper respiratory specimens during the acute  phase of infection. The lowest concentration of SARS-CoV-2 viral copies this assay can detect is 138 copies/mL. A negative result does not preclude SARS-Cov-2 infection and should not be used as the sole basis for treatment or other patient management decisions. A negative result may occur with  improper specimen collection/handling, submission of specimen other than nasopharyngeal swab, presence of viral mutation(s) within the areas targeted by this assay, and inadequate number of viral copies(<138 copies/mL). A negative result must be combined with clinical observations, patient history, and epidemiological information. The expected result is Negative.  Fact Sheet for Patients:  EntrepreneurPulse.com.au  Fact Sheet for Healthcare Providers:  IncredibleEmployment.be  This test is no t yet approved or cleared by the [place] FDA and  has been authorized for detection and/or diagnosis of SARS-CoV-2 by FDA under an Emergency Use Authorization (EUA). This EUA will remain  in effect (meaning this test can be used) for the duration of the COVID-19 declaration under Section 564(b)(1) of the Act, 21 U.S.C.section  360bbb-3(b)(1), unless the authorization is terminated  or revoked sooner.       Influenza A by PCR NEGATIVE NEGATIVE Final   Influenza B by PCR NEGATIVE NEGATIVE Final    Comment: (NOTE) The Xpert Xpress SARS-CoV-2/FLU/RSV plus assay is intended as an aid in the diagnosis of influenza from Nasopharyngeal swab specimens and should not be used as a sole basis for treatment. Nasal washings and aspirates are unacceptable for Xpert Xpress SARS-CoV-2/FLU/RSV testing.  Fact Sheet for Patients: EntrepreneurPulse.com.au  Fact Sheet for Healthcare Providers: IncredibleEmployment.be  This test is not yet approved or cleared by the [place] FDA and has been authorized for detection and/or diagnosis of SARS-CoV-2 by FDA under an Emergency Use Authorization (EUA). This EUA will remain in effect (meaning this test can be used) for the duration of the COVID-19 declaration under Section 564(b)(1) of the Act, 21 U.S.C. section 360bbb-3(b)(1), unless the authorization is terminated or revoked.  Performed at [facility], [place] [address] [place], [place] [address]          Radiology Studies: CT Abdomen Pelvis W Contrast  Result Date: 08/27/2020 CLINICAL DATA:  Abdominal distension. Abdominal pain and diarrhea for 3 days EXAM: CT ABDOMEN AND PELVIS WITH CONTRAST TECHNIQUE: Multidetector CT imaging of the abdomen and pelvis was performed using the standard protocol following bolus administration of intravenous contrast. CONTRAST:  92mL OMNIPAQUE IOHEXOL 300 MG/ML  SOLN COMPARISON:  None. FINDINGS: Lower chest: Borderline heart size. Extensive aortic and coronary atherosclerosis. Bands of scarring in the bilateral bases. Hepatobiliary: Cholecystectomy which likely accounts for mild intrahepatic biliary dilatation given normal biliary labs.Simple cyst in the caudate lobe measuring 15 mm. Pancreas: Subcentimeter simple cyst in the uncinate  process and head without ductal dilatation, considered incidental and non worrisome given patient age. Spleen: Unremarkable. Adrenals/Urinary Tract: Negative adrenals. No hydronephrosis or stone. Unremarkable bladder. Stomach/Bowel: Numerous colonic diverticula. Colonic fluid levels are seen to the descending segment diffuse bowel gas which may be ingested. Oral contrast reaches the proximal colon. Vascular/Lymphatic: No acute vascular abnormality. Atheromatous calcification of the aorta and iliacs which is extensive. The aorta is tortuous with aneurysmal enlargement below the renal arteries up to 4.1 Cm. Recommend follow-up every 12 months and vascular consultation. This recommendation follows ACR consensus guidelines: White Paper of the ACR Incidental Findings Committee II on Vascular Findings. J Am Coll Radiol 2013; 10:789-794. No mass or adenopathy. Reproductive:Unremarkable for age Other: No ascites or pneumoperitoneum. Musculoskeletal: Generalized osteopenia. Remote compression fractures of  T12-L3 with cement augmentation at T12 and L1. No acute osseous finding IMPRESSION: 1. Diffuse small bowel gas without obstruction or visible inflammation. 2. Extensive atherosclerosis including the coronary arteries. Fusiform abdominal aortic aneurysm measuring up to 4.1 cm. If appropriate for comorbidities, follow-up recommendations are above. 3. Left colonic diverticula. Electronically Signed   By: Monte Fantasia M.D.   On: 08/27/2020 10:48        Scheduled Meds:  feeding supplement  1 Container Oral BID BM   levothyroxine  50 mcg Oral QAC breakfast   Continuous Infusions:  lactated ringers 100 mL/hr at 08/29/20 0655   pantoprozole (PROTONIX) infusion 8 mg/hr (08/29/20 0733)     LOS: 1 day    Time spent: 35 mins.More than 50% of that time was spent in counseling and/or coordination of care.      Shelly Coss, MD [facility] P6/15/2022, 8:16 AM

## 2020-08-29 NOTE — Plan of Care (Signed)
  Problem: Pain Managment: Goal: General experience of comfort will improve Outcome: Progressing   Problem: Safety: Goal: Ability to remain free from injury will improve Outcome: Progressing   Problem: Elimination: Goal: Will not experience complications related to bowel motility Outcome: Progressing   

## 2020-08-29 NOTE — Progress Notes (Signed)
2 Days Post-Op  Subjective: Patient denies pain this morning.  No further diarrhea today.  No nausea.  Doesn't like our clear liquids here.  Had a lengthy conversation with the patient this morning via a [place] interpretor as no family was present.  The patient states her understanding of her situation is "I can't have surgery and I have a mass between my stomach and intestines and have 1 month left to live." I told her I didn't know where that time frame came from, but that the rest of generally correct.  I asked if she felt like she was strong enough to have any type of an operation and she stated "No."  I told her that I agreed with that as any operation, curative vs just a bypass, would be very detrimental to what life she has left given her current frailty.  She agreed and stated her ultimate goal is to just go home for what life she has left.   ROS: See above, otherwise other systems negative  Objective: Vital signs in last 24 hours: Temp:  [98.1 F (36.7 C)-99.3 F (37.4 C)] 99.3 F (37.4 C) (06/15 0503) Pulse Rate:  [51-63] 63 (06/15 0503) Resp:  [16-18] 16 (06/15 0503) BP: (109-134)/(46-56) 134/56 (06/15 0503) SpO2:  [94 %-97 %] 94 % (06/15 0503) Last BM Date: 08/28/20  Intake/Output from previous day: 06/14 0701 - 06/15 0700 In: 682 [P.O.:682] Out: 3450 [Urine:3450] Intake/Output this shift: Total I/O In: 173.4 [I.V.:173.4] Out: -   PE: Heart: regular Lungs: CTAB Abd: soft, NT, ND, +BS  Lab Results:  Recent Labs    08/28/20 0032 08/29/20 0536  WBC 5.9 6.1  HGB 8.6* 9.3*  HCT 25.6* 28.3*  PLT 136* 152   BMET Recent Labs    08/28/20 0032 08/29/20 0536  NA 137 140  K 3.7 3.7  CL 108 109  CO2 26 26  GLUCOSE 82 101*  BUN 17 8  CREATININE 0.50 0.51  CALCIUM 7.7* 8.1*   PT/INR No results for input(s): LABPROT, INR in the last 72 hours. CMP     Component Value Date/Time   NA 140 08/29/2020 0536   NA 138 04/29/2017 1705   K 3.7 08/29/2020 0536    CL 109 08/29/2020 0536   CO2 26 08/29/2020 0536   GLUCOSE 101 (H) 08/29/2020 0536   BUN 8 08/29/2020 0536   BUN 12 04/29/2017 1705   CREATININE 0.51 08/29/2020 0536   CALCIUM 8.1 (L) 08/29/2020 0536   PROT 4.6 (L) 08/28/2020 0032   PROT 6.0 04/29/2017 1705   ALBUMIN 2.5 (L) 08/28/2020 0032   ALBUMIN 3.7 04/29/2017 1705   AST 20 08/28/2020 0032   ALT 12 08/28/2020 0032   ALKPHOS 35 (L) 08/28/2020 0032   BILITOT 0.8 08/28/2020 0032   BILITOT 0.2 04/29/2017 1705   GFRNONAA >60 08/29/2020 0536   GFRAA 92 04/29/2017 1705   Lipase     Component Value Date/Time   LIPASE 50 08/27/2020 0705       Studies/Results: CT Abdomen Pelvis W Contrast  Result Date: 08/27/2020 CLINICAL DATA:  Abdominal distension. Abdominal pain and diarrhea for 3 days EXAM: CT ABDOMEN AND PELVIS WITH CONTRAST TECHNIQUE: Multidetector CT imaging of the abdomen and pelvis was performed using the standard protocol following bolus administration of intravenous contrast. CONTRAST:  50mL OMNIPAQUE IOHEXOL 300 MG/ML  SOLN COMPARISON:  None. FINDINGS: Lower chest: Borderline heart size. Extensive aortic and coronary atherosclerosis. Bands of scarring in the bilateral bases. Hepatobiliary: Cholecystectomy  which likely accounts for mild intrahepatic biliary dilatation given normal biliary labs.Simple cyst in the caudate lobe measuring 15 mm. Pancreas: Subcentimeter simple cyst in the uncinate process and head without ductal dilatation, considered incidental and non worrisome given patient age. Spleen: Unremarkable. Adrenals/Urinary Tract: Negative adrenals. No hydronephrosis or stone. Unremarkable bladder. Stomach/Bowel: Numerous colonic diverticula. Colonic fluid levels are seen to the descending segment diffuse bowel gas which may be ingested. Oral contrast reaches the proximal colon. Vascular/Lymphatic: No acute vascular abnormality. Atheromatous calcification of the aorta and iliacs which is extensive. The aorta is tortuous  with aneurysmal enlargement below the renal arteries up to 4.1 Cm. Recommend follow-up every 12 months and vascular consultation. This recommendation follows ACR consensus guidelines: White Paper of the ACR Incidental Findings Committee II on Vascular Findings. J Am Coll Radiol 2013; 10:789-794. No mass or adenopathy. Reproductive:Unremarkable for age Other: No ascites or pneumoperitoneum. Musculoskeletal: Generalized osteopenia. Remote compression fractures of T12-L3 with cement augmentation at T12 and L1. No acute osseous finding IMPRESSION: 1. Diffuse small bowel gas without obstruction or visible inflammation. 2. Extensive atherosclerosis including the coronary arteries. Fusiform abdominal aortic aneurysm measuring up to 4.1 cm. If appropriate for comorbidities, follow-up recommendations are above. 3. Left colonic diverticula. Electronically Signed   By: Monte Fantasia M.D.   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  She has a basic understanding of this.  I'm not sure I think she has a full understanding of everything going on, but I do think she feels she is too weak and frail for any type of an operation and wants to go home and be comfortable.  This was the impression I got from her DIL yesterday when she was present as well. -agree with palliative care evaluation today. -patient does not need a palliative g-tube at this point, but if that is something that is needed moving forward, would ask IR to consider to avoid surgical intervention.   FEN - CLD VTE - on hold due to bleeding upon admission, SCDs ID - none currently   LOS: 1 day    Kendra Dickson , [facility] 08/29/2020, 9:10 AM Please see Amion for pager number during day hours 7:00am-4:30pm or  7:00am -11:30am on weekends

## 2020-08-29 NOTE — Progress Notes (Signed)
[facility] Progress Note  Kendra Dickson 87 y.o. 13-Mar-1931  CC:  Duodenal adenocarcinoma  Subjective: Patient denies abdominal pain, nausea/vomiting.  Had a red liquid BM yesterday afternoon but no further BMs since that time.  ROS : Review of Systems  Cardiovascular:  Negative for chest pain and palpitations.  Gastrointestinal:  Positive for blood in stool. Negative for abdominal pain, constipation, diarrhea, heartburn, melena, nausea and vomiting.     Objective: Vital signs in last 24 hours: Vitals:   08/28/20 2015 08/29/20 0503  BP: (!) 109/50 (!) 134/56  Pulse: 62 63  Resp: 18 16  Temp: 98.1 F (36.7 C) 99.3 F (37.4 C)  SpO2: 96% 94%    Physical Exam:  General:  Lethargic, resting in bed, no acute distress  Head:  Normocephalic, without obvious abnormality, atraumatic  Eyes:  Conjunctival pallor, EOMs intact  Lungs:   Clear to auscultation bilaterally, respirations unlabored  Heart:  Regular rate and rhythm, S1, S2 normal  Abdomen:   Soft with mild epigastric tenderness, normoactive bowel sounds  Extremities: Extremities normal, atraumatic, no  edema    Lab Results: Recent Labs    08/28/20 0032 08/29/20 0536  NA 137 140  K 3.7 3.7  CL 108 109  CO2 26 26  GLUCOSE 82 101*  BUN 17 8  CREATININE 0.50 0.51  CALCIUM 7.7* 8.1*    Recent Labs    08/27/20 0705 08/28/20 0032  AST 16 20  ALT 10 12  ALKPHOS 41 35*  BILITOT 0.4 0.8  PROT 5.4* 4.6*  ALBUMIN 2.9* 2.5*    Recent Labs    08/28/20 0032 08/29/20 0536  WBC 5.9 6.1  NEUTROABS  --  3.7  HGB 8.6* 9.3*  HCT 25.6* 28.3*  MCV 89.8 90.4  PLT 136* 152    No results for input(s): LABPROT, INR in the last 72 hours.    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  Normal Cr (0.50) -Hgb 9.3, stable -Ca 19-9 elevated to 178; CEA elevated to 6.5  Plan: Poor surgical candidate, surgical team and patient defer  surgery at this time.   Continue Protonix BID indefinitely.   Continue to monitor H&H with transfusion as needed to maintain Hgb >7-8.    If re-bleeding occurs, consider CT-A with IR consultation of embolization if positive.  Agree with palliative care consultation.   Advance diet as tolerated.  [facility] will sign off. Please contact us if we can be of any further assistance during this hospital stay.   Salley Slaughter PA-C 08/29/2020, 11:18 AM  Contact #  [phone]

## 2020-08-30 DIAGNOSIS — C269 Malignant neoplasm of ill-defined sites within the digestive system: Secondary | ICD-10-CM

## 2020-08-30 MED ORDER — ALUM & MAG HYDROXIDE-SIMETH 200-200-20 MG/5ML PO SUSP: 30.0000 mL | Freq: Four times a day (QID) | ORAL | Status: DC | PRN

## 2020-08-30 NOTE — Consult Note (Signed)
Consultation Note Date: 08/30/2020   Patient Name: Kendra Dickson  DOB: [birth year]  MRN: [id number]  Age / Sex: 87 y.o., female  PCP: Rudene Anda, MD Referring Physician: Shelly Coss, MD  Reason for Consultation: Establishing goals of care  HPI/Patient Profile: 87 y.o. female  with past medical history of Chronic diarrhea, chronic abdominal pain, hypothyroidism, GERD admitted on 08/27/2020 with diarrhea and weakness.  She was reported to have darker stool for the past 10 days and was admitted for further management of GI bleed.  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  Biopsy returned revealing invasive adenocarcinoma.  Surgery has evaluated and patient had told surgery team this morning that she did not feel surgery was good option.  Palliative consulted for goals of care.  Clinical Assessment and Goals of Care: I met today with Kendra Dickson and her son.  [place] interpreter services utilized.   I introduced palliative care as specialized medical care for people living with serious illness. It focuses on providing relief from the symptoms and stress of a serious illness. The goal is to improve quality of life for both the patient and the family.  We discussed clinical course as well as wishes moving forward in regard to advanced directives and care plan in light of discovery of adenocarcinoma in her duodenum.  We discussed difference between a aggressive medical intervention path and a palliative, comfort focused care path.  Values and goals of care important to patient and family were attempted to be elicited.  Her son is very concerned about her lack of nutrition.  I talked him about the fact that this is a complicated problem and there is not a good solution to her inability to eat as it is related to obstructive lesion in her duodenum.  Her son had many questions about surgical  intervention and requesting to speak with the surgical team.  I reached out and Dr. Ninfa Linden was able to come and discuss with him in conjunction with interpreter.  He explained that the surgical procedure involved would be a large surgery that would be difficult for [name] to tolerate in her current frail state.  Following conversation with Dr. Ninfa Linden, her son and I began discussion about potential options for hospice services.  I described hospice philosophy and he was asking very specific questions about services provided by hospice agency.  We then discussed plan to see if liaison from hospice may be able to join Kendra Dickson tomorrow for another meeting to answer his specific questions.   Questions and concerns addressed.   PMT will continue to support holistically.   SUMMARY OF RECOMMENDATIONS   -Discussed today that [name] has irreversible illness.  Dr. Ninfa Linden met with Kendra Dickson to discuss surgical intervention and why this would be a major undertaking that may have more burden than benefit in her case with her advanced age and more importantly her overall frail state. -Plan for meeting tomorrow with hospice liaison at 1030.  Code Status/Advance Care Planning: Full code-not addressed today  Psycho-social/Spiritual:  Desire for further Chaplaincy support:no Additional Recommendations: Caregiving  Support/Resources and Education on Hospice  Prognosis:  < 6 months  Discharge Planning: To Be Determined      Primary Diagnoses: Present on Admission:  Symptomatic anemia  Upper GI bleed   I have reviewed the medical record, interviewed the patient and family, and examined the patient. The following aspects are pertinent.  Past Medical History:  Diagnosis Date   Arthritis    Asthma    Hypothyroidism    Social History   Socioeconomic History   Marital status: Widowed    Spouse name: Not on file   Number of children: 3   Years of education: Not on file   Highest education level: Not  on file  Occupational History   Not on file  Tobacco Use   Smoking status: Never   Smokeless tobacco: Never  Vaping Use   Vaping Use: Never used  Substance and Sexual Activity   Alcohol use: No   Drug use: No   Sexual activity: Never  Other Topics Concern   Not on file  Social History Narrative   She is from [place]. Moved here in 2017. Lives with son and daughter in law. Has three children.    Social Determinants of Health   Financial Resource Strain: Not on file  Food Insecurity: Not on file  Transportation Needs: Not on file  Physical Activity: Not on file  Stress: Not on file  Social Connections: Not on file   Family History  Problem Relation Age of Onset   Cancer Father        Gallbladder   Pancreatic cancer Father    Cancer Sister        Gallbladder   Scheduled Meds:  feeding supplement  1 Container Oral BID BM   levothyroxine  50 mcg Oral QAC breakfast   Continuous Infusions:  lactated ringers 100 mL/hr at 08/30/20 0340   pantoprozole (PROTONIX) infusion 8 mg/hr (08/30/20 0358)   PRN Meds:.morphine injection, ondansetron **OR** ondansetron (ZOFRAN) IV, polyvinyl alcohol Medications Prior to Admission:  Prior to Admission medications   Medication Sig Start Date End Date Taking? Authorizing Provider  aspirin 81 MG chewable tablet Chew by mouth daily.   Yes [provider]  levothyroxine (SYNTHROID) 50 MCG tablet TAKE 1 TABLET BY MOUTH DAILY BEFORE BREAKFAST Patient taking differently: Take 50 mcg by mouth daily before breakfast. 09/17/18  Yes Wendie Agreste, MD  Multiple Vitamins-Minerals (CENTRUM SILVER PO) Take 1 tablet by mouth daily.   Yes [provider]  Omega-3 Fatty Acids (OMEGA-3 PO) Take 1 capsule by mouth daily.   Yes [provider]  pantoprazole (PROTONIX) 40 MG tablet Take 40 mg by mouth daily. 07/25/20  Yes [provider]  polyvinyl alcohol (LIQUIFILM TEARS) 1.4 % ophthalmic solution Place 1 drop into both eyes  as needed for dry eyes.   Yes [provider]  traZODone (DESYREL) 50 MG tablet TAKE 1 TABLET BY MOUTH EVERYDAY AT BEDTIME Patient taking differently: Take 50 mg by mouth at bedtime. 09/17/18  Yes Wendie Agreste, MD  glycopyrrolate (ROBINUL) 1 MG tablet Take 1 tablet (1 mg total) by mouth 2 (two) times daily. Patient not taking: Reported on 08/27/2020 10/08/18   Ladene Artist, MD   No Known Allergies Review of Systems  Constitutional:  Positive for activity change, appetite change, fatigue and unexpected weight change.  Gastrointestinal:  Positive for abdominal pain and blood in stool.  Neurological:  Positive  for weakness.  Psychiatric/Behavioral:  Positive for sleep disturbance.    Physical Exam General: Alert, awake, in no acute distress.  Frail appearing HEENT: No bruits, no goiter, no JVD Heart: Regular rate and rhythm. No murmur appreciated. Lungs: Good air movement, clear Abdomen: Soft, nontender, nondistended, positive bowel sounds.   Ext: No significant edema Skin: Warm and dry Neuro: Grossly intact, nonfocal.   Vital Signs: BP 126/65 (BP Location: Left Arm)   Pulse 65   Temp 98.7 F (37.1 C) (Oral)   Resp 16   Ht _0  (1.448 m)   Wt 38.5 kg   SpO2 97%   BMI 18.37 kg/m  Pain Scale: 0-10   Pain Score: 0-No pain   SpO2: SpO2: 97 % O2 Device:SpO2: 97 % O2 Flow Rate: .   IO: Intake/output summary:  Intake/Output Summary (Last 24 hours) at 08/30/2020 0708 Last data filed at 08/30/2020 0600 Gross per 24 hour  Intake 2437.26 ml  Output 1550 ml  Net 887.26 ml    LBM: Last BM Date: 08/29/20 Baseline Weight: Weight: 38.5 kg Most recent weight: Weight: 38.5 kg     Palliative Assessment/Data:     Time In: 1245 Time Out: 1410 Time Total: 85 minutes Greater than 50%  of this time was spent counseling and coordinating care related to the above assessment and plan.  Signed by: Micheline Rough, MD   Please contact Palliative Medicine Team phone at  [phone] for questions and concerns.  For individual provider: See Shea Evans

## 2020-08-30 NOTE — Progress Notes (Signed)
    3 Days Post-Op  Subjective: Patient c/o some reflux type pain this am.  Otherwise no bleeding as far as I can tell from her family.  No vomiting.  Taking in a small amount of liquids.  ROS: See above, otherwise other systems negative  Objective: Vital signs in last 24 hours: Temp:  [97.8 F (36.6 C)-98.7 F (37.1 C)] 98.7 F (37.1 C) (06/16 0531) Pulse Rate:  [58-72] 65 (06/16 0531) Resp:  [16] 16 (06/15 1724) BP: (122-136)/(60-66) 126/65 (06/16 0531) SpO2:  [97 %-98 %] 97 % (06/16 0531) Last BM Date: 08/29/20  Intake/Output from previous day: 06/15 0701 - 06/16 0700 In: 2437.3 [I.V.:2437.3] Out: 1550 [Urine:1550] Intake/Output this shift: No intake/output data recorded.  PE: Heart: regular Lungs: CTAB Abd: soft, NT, ND, +BS  Lab Results:  Recent Labs    08/28/20 0032 08/29/20 0536  WBC 5.9 6.1  HGB 8.6* 9.3*  HCT 25.6* 28.3*  PLT 136* 152   BMET Recent Labs    08/28/20 0032 08/29/20 0536  NA 137 140  K 3.7 3.7  CL 108 109  CO2 26 26  GLUCOSE 82 101*  BUN 17 8  CREATININE 0.50 0.51  CALCIUM 7.7* 8.1*   PT/INR No results for input(s): LABPROT, INR in the last 72 hours. CMP     Component Value Date/Time   NA 140 08/29/2020 0536   NA 138 04/29/2017 1705   K 3.7 08/29/2020 0536   CL 109 08/29/2020 0536   CO2 26 08/29/2020 0536   GLUCOSE 101 (H) 08/29/2020 0536   BUN 8 08/29/2020 0536   BUN 12 04/29/2017 1705   CREATININE 0.51 08/29/2020 0536   CALCIUM 8.1 (L) 08/29/2020 0536   PROT 4.6 (L) 08/28/2020 0032   PROT 6.0 04/29/2017 1705   ALBUMIN 2.5 (L) 08/28/2020 0032   ALBUMIN 3.7 04/29/2017 1705   AST 20 08/28/2020 0032   ALT 12 08/28/2020 0032   ALKPHOS 35 (L) 08/28/2020 0032   BILITOT 0.8 08/28/2020 0032   BILITOT 0.2 04/29/2017 1705   GFRNONAA >60 08/29/2020 0536   GFRAA 92 04/29/2017 1705   Lipase     Component Value Date/Time   LIPASE 50 08/27/2020 0705       Studies/Results: No results  found.  Anti-infectives: Anti-infectives (From admission, onward)    None        Assessment/Plan Mel[place] secondary to invasive adenocarcinoma of duodenal mass -Hospice meeting today -discussed plans yesterday with family regarding no surgery and seem to all be on the same page regarding this.  Family wanting to discuss what happens if she were to be rebleed.  We discussed 2 options which are do nothing and let nature take it's course vs possible embolization by IR.  I encouraged them to discuss this question in their meeting today.  I also let Dr. Domingo Cocking know this had come up. -add maalox to help with some indigestion type symptoms.  FEN - CLD VTE - on hold due to bleeding upon admission, SCDs ID - none currently   LOS: 2 days    Henreitta Cea , [facility] 08/30/2020, 11:16 AM Please see Amion for pager number during day hours 7:00am-4:30pm or 7:00am -11:30am on weekends

## 2020-08-30 NOTE — Progress Notes (Signed)
PROGRESS NOTE    Kendra Dickson  WOE:[id number] DOB: [birth year] DOA: 08/27/2020 PCP: Rudene Anda, MD   Chief Complain: Diarrhea, weakness  Brief Narrative: Patient is a 87 year old female with history of chronic diarrhea, chronic abdominal pain, hypothyroidism, GERD who presented with diarrhea and weakness from home.  She speaks [place].  As per the daughter at the bedside, she has issues with her abdomen since several years.  Patient reported epigastric pain on presentation.  There was also report of darker stool for last 10 days.  Patient does not take NSAIDs or aspirin.  On presentation her hemoglobin was in the range of 8.  FOBT was positive.  She was admitted for the management of GI bleed.  GI consulted and she underwent EGD with finding of fungating, infiltrative ulcerated mass in the duodenum.  General surgery consulted for surgical options but there is limited choice and she is not an appropriate candidate.  Pathology showed invasive adenocarcinoma.  Discussion being held with family about hospice options.  Palliative care consulted and following.  Assessment & Plan:   Active Problems:   Symptomatic anemia   Upper GI bleed   Upper GI bleed/symptomatic anemia: Report of dark stools at home.  History of chronic abdominal pain, diarrhea.  Hemoglobin was in the range of 7 on presentation.  She was transfused with 2 units of PRBC  Hemoglobin in the range of 9. We will continue to monitor H&H, transfuse if needed. Continue PPI twice daily indefinitely  Duodenal mass: GI consulted and she underwent EGD with finding of fungating, infiltrative ulcerated mass in the duodenum. Elevated CEA,CA- 19-9 . She was on Protonix drip.    Biopsy revealed invasive adenocarcinoma.  Patient is a poor candidate for any surgical options/poor candidate for treatment for the cancer .  We will continue to discuss goals of care with the family.  We have consulted  palliative care .  Son/Daughter in law is interested to  know about residential hospice versus hospice at home.  TOC consulted.  She can only tolerate clear liquid diet.  She has very poor oral intake.  Hyperkalemia: Resolved  Thrombocytopenia: Mild.  Stable  Hypothyroidism: On Synthyroid.  Elevated troponin: Mild elevated troponin, no chest pain.  EKG did not show any ischemic changes.  Most likely associated with supply demand ischemia from type II MI secondary to anemia.  No need of further work-up  History of chronic diarrhea/pancreatic insufficiency: Follows with gastro[facility]ology at [facility], Dr. Earlean Shawl.   Severe protein calorie malnutrition/failure to thrive: BMI of 18.3.  We have requested for nutrition evaluation.  Most likely associated with malignancy and poor appetite.    Nutrition Problem: Increased nutrient needs Etiology: catabolic illness, chronic illness      DVT prophylaxis: SCD Code Status: Full code Family Communication: Son at the bedside Status is: Inpatient  Dispo: The patient is from: Home              Anticipated d/c is to: Home              Patient currently is not medically stable to d/c.   Difficult to place patient No     Consultants: GI,surgery   Procedures:EgD  Antimicrobials:  Anti-infectives (From admission, onward)    None       Subjective: Patient seen and examined the bedside this morning.  Medically stable.  Comfortable.  No new issues.  Son and daughter-in-law at the bedside they are wondering and had many questions about hospice  Objective: Vitals:   08/29/20 0503 08/29/20 1724 08/29/20 2046 08/30/20 0531  BP: (!) 134/56 122/66 136/60 126/65  Pulse: 63 (!) 58 72 65  Resp: 16 16    Temp: 99.3 F (37.4 C) 97.8 F (36.6 C) 98.6 F (37 C) 98.7 F (37.1 C)  TempSrc: Oral Oral Oral Oral  SpO2: 94% 98% 97% 97%  Weight:      Height:        Intake/Output Summary (Last 24 hours) at 08/30/2020 1242 Last data filed at 08/30/2020 0600 Gross per 24 hour  Intake 2263.91 ml   Output 1550 ml  Net 713.91 ml   Filed Weights   08/27/20 1736  Weight: 38.5 kg    Examination:  General exam: Overall comfortable, not in distress, cachectic, frail, deconditioned HEENT: PERRL Respiratory system:  no wheezes or crackles  Cardiovascular system: S1 & S2 heard, RRR.  Gastrointestinal system: Abdomen is nondistended, soft and nontender. Central nervous system: Alert and awake Extremities: No edema, no clubbing ,no cyanosis Skin: No rashes, no ulcers,no icterus       Data Reviewed: I have personally reviewed following labs and imaging studies  CBC: Recent Labs  Lab 08/27/20 0705 08/27/20 1757 08/28/20 0032 08/29/20 0536  WBC 9.1  --  5.9 6.1  NEUTROABS  --   --   --  3.7  HGB 7.5* 9.3* 8.6* 9.3*  HCT 23.3* 28.6* 25.6* 28.3*  MCV 97.1  --  89.8 90.4  PLT 221  --  136* 784   Basic Metabolic Panel: Recent Labs  Lab 08/27/20 0705 08/28/20 0032 08/29/20 0536  NA 139 137 140  K 5.2* 3.7 3.7  CL 108 108 109  CO2 25 26 26   GLUCOSE 171* 82 101*  BUN 46* 17 8  CREATININE 0.59 0.50 0.51  CALCIUM 8.4* 7.7* 8.1*   GFR: Estimated Creatinine Clearance: 29.5 mL/min (by C-G formula based on SCr of 0.51 mg/dL). Liver Function Tests: Recent Labs  Lab 08/27/20 0705 08/28/20 0032  AST 16 20  ALT 10 12  ALKPHOS 41 35*  BILITOT 0.4 0.8  PROT 5.4* 4.6*  ALBUMIN 2.9* 2.5*   Recent Labs  Lab 08/27/20 0705  LIPASE 50   No results for input(s): AMMONIA in the last 168 hours. Coagulation Profile: No results for input(s): INR, PROTIME in the last 168 hours. Cardiac Enzymes: No results for input(s): CKTOTAL, CKMB, CKMBINDEX, TROPONINI in the last 168 hours. BNP (last 3 results) No results for input(s): PROBNP in the last 8760 hours. HbA1C: No results for input(s): HGBA1C in the last 72 hours. CBG: No results for input(s): GLUCAP in the last 168 hours. Lipid Profile: No results for input(s): CHOL, HDL, LDLCALC, TRIG, CHOLHDL, LDLDIRECT in the last 72  hours. Thyroid Function Tests: No results for input(s): TSH, T4TOTAL, FREET4, T3FREE, THYROIDAB in the last 72 hours. Anemia Panel: No results for input(s): VITAMINB12, FOLATE, FERRITIN, TIBC, IRON, RETICCTPCT in the last 72 hours. Sepsis Labs: No results for input(s): PROCALCITON, LATICACIDVEN in the last 168 hours.  Recent Results (from the past 240 hour(s))  Resp Panel by RT-PCR (Flu A&B, Covid) Nasopharyngeal Swab     Status: None   Collection Time: 08/27/20  7:56 AM   Specimen: Nasopharyngeal Swab; Nasopharyngeal(NP) swabs in vial transport medium  Result Value Ref Range Status   SARS Coronavirus 2 by RT PCR NEGATIVE NEGATIVE Final    Comment: (NOTE) SARS-CoV-2 target nucleic acids are NOT DETECTED.  The SARS-CoV-2 RNA is generally detectable in upper respiratory  specimens during the acute phase of infection. The lowest concentration of SARS-CoV-2 viral copies this assay can detect is 138 copies/mL. A negative result does not preclude SARS-Cov-2 infection and should not be used as the sole basis for treatment or other patient management decisions. A negative result may occur with  improper specimen collection/handling, submission of specimen other than nasopharyngeal swab, presence of viral mutation(s) within the areas targeted by this assay, and inadequate number of viral copies(<138 copies/mL). A negative result must be combined with clinical observations, patient history, and epidemiological information. The expected result is Negative.  Fact Sheet for Patients:  EntrepreneurPulse.com.au  Fact Sheet for Healthcare Providers:  IncredibleEmployment.be  This test is no t yet approved or cleared by the [place] FDA and  has been authorized for detection and/or diagnosis of SARS-CoV-2 by FDA under an Emergency Use Authorization (EUA). This EUA will remain  in effect (meaning this test can be used) for the duration of the COVID-19  declaration under Section 564(b)(1) of the Act, 21 U.S.C.section 360bbb-3(b)(1), unless the authorization is terminated  or revoked sooner.       Influenza A by PCR NEGATIVE NEGATIVE Final   Influenza B by PCR NEGATIVE NEGATIVE Final    Comment: (NOTE) The Xpert Xpress SARS-CoV-2/FLU/RSV plus assay is intended as an aid in the diagnosis of influenza from Nasopharyngeal swab specimens and should not be used as a sole basis for treatment. Nasal washings and aspirates are unacceptable for Xpert Xpress SARS-CoV-2/FLU/RSV testing.  Fact Sheet for Patients: EntrepreneurPulse.com.au  Fact Sheet for Healthcare Providers: IncredibleEmployment.be  This test is not yet approved or cleared by the [place] FDA and has been authorized for detection and/or diagnosis of SARS-CoV-2 by FDA under an Emergency Use Authorization (EUA). This EUA will remain in effect (meaning this test can be used) for the duration of the COVID-19 declaration under Section 564(b)(1) of the Act, 21 U.S.C. section 360bbb-3(b)(1), unless the authorization is terminated or revoked.  Performed at [facility], [place] [address], [place], [place] [address]          Radiology Studies: No results found.      Scheduled Meds:  feeding supplement  1 Container Oral BID BM   levothyroxine  50 mcg Oral QAC breakfast   Continuous Infusions:  lactated ringers 100 mL/hr at 08/30/20 0340     LOS: 2 days    Time spent: 25 mins.More than 50% of that time was spent in counseling and/or coordination of care.      Shelly Coss, MD [facility] P6/16/2022, 12:42 PM

## 2020-08-30 NOTE — Progress Notes (Addendum)
Manufacturing engineer [facility]) Hospital Liaison: RN note     Addendum: 4:30pm: Pt has been deemed eligible by [facility] MD for hospice services at home.  Asked by Dr. Domingo Cocking, PMT, to join him in meeting with family to discuss care with hospice at home vs inpatient hospice facility.   Chart and patient information under review by [facility] physician. Hospice eligibility pending currently.   TOC Rodaney aware.  Writer met at bedside with the patient, her son, and daughter-in-law,  to initiate education related to hospice philosophy, services and team approach to care.  Video interpreter was used for entirety of meeting.  Questions specific to  ADL's and insurance answered.  All verbalized understanding of information given. Per discussion, plan is for discharge to home once DME is delivered.      Please send signed and completed DNR form home with patient/family. Patient will need prescriptions for discharge comfort medications until pt can be admitted onto hospice services.      DME needs have been discussed.  Patient currently had no equipment in the home.  Patient/family requests the following DME for delivery to the home:  hospital bed, OBT, and BSC. [employer] equipment manager has ordered the DME for delivery tomorrow morning.  Home address has been verified and is correct in the chart.      Please notify [facility] when patient is ready to leave the unit at discharge. (Call [phone] or [phone] after 5pm.) [facility] information and contact numbers given to  pt's daughter-in-law Delia Heady.       Please do not hesitate to call with questions.     Thank you for the opportunity to participate in this patient's care.     Domenic Moras, BSN, RN     [place] (listed on AMION under [phone]  [phone] (24h on call)

## 2020-08-30 NOTE — TOC Initial Note (Signed)
Transition of Care [facility]) - Initial/Assessment Note    Patient Details  Name: Kendra Dickson MRN: [id number] Date of Birth: [birth year]  Transition of [facility]) CM/SW Contact:    Trish Mage, LCSW Phone Number: 08/30/2020, 12:58 PM  Clinical Narrative:    Spoke with daughter and son in law via interpreter per palliative request.  Patient is eligible for hospice services through [facility] and Chrislyn Edison Pace was also here working with family.  I was told family was interested in finding out about availability of [facility] and personal care services.  Gave family information on both, and encouraged them to call to find out about cost and availability.  Their questions centered around cost, and why insurance would not cover.  They are also interested in applying for MCD, but patient is not yet a citizen, green care only. They plan to contact immigration department to get further clarification about this. No further questions. Ms Edison Pace will take care of ordering DME for the home. TOC will continue to follow during the course of hospitalization.                Expected Discharge Plan: Home w Hospice Care Barriers to Discharge: No Barriers Identified   Patient Goals and CMS Choice        Expected Discharge Plan and Services Expected Discharge Plan: Home w Hospice Care In-house Referral: Clinical Social Work     Living arrangements for the past 2 months: Single Family Home                                      Prior Living Arrangements/Services Living arrangements for the past 2 months: Single Family Home Lives with:: Adult Children Patient language and need for interpreter reviewed:: Yes        Need for Family Participation in Patient Care: Yes (Comment) Care giver support system in place?: Yes (comment)   Criminal Activity/Legal Involvement Pertinent to Current Situation/Hospitalization: No - Comment as needed  Activities of Daily Living Home Assistive Devices/Equipment:  Eyeglasses ADL Screening (condition at time of admission) Patient's cognitive ability adequate to safely complete daily activities?: Yes Is the patient deaf or have difficulty hearing?: Yes Does the patient have difficulty seeing, even when wearing glasses/contacts?: No Does the patient have difficulty concentrating, remembering, or making decisions?: No Patient able to express need for assistance with ADLs?: Yes Does the patient have difficulty dressing or bathing?: No Independently performs ADLs?: Yes (appropriate for developmental age) Does the patient have difficulty walking or climbing stairs?: Yes (secondary to weakness) Weakness of Legs: Both Weakness of Arms/Hands: None  Permission Sought/Granted Permission sought to share information with : Family Supports Permission granted to share information with : No  Share Information with NAME: lim,hyun (Daughter)   [phone]           Emotional Assessment Appearance:: Appears stated age Attitude/Demeanor/Rapport: Unable to Assess Affect (typically observed): Unable to Assess Orientation: : Oriented to Self Alcohol / Substance Use: Not Applicable Psych Involvement: No (comment)  Admission diagnosis:  Elevated troponin [R77.8] Symptomatic anemia [D64.9] Upper GI bleed [K92.2] Patient Active Problem List   Diagnosis Date Noted   Upper GI bleed 08/28/2020   Symptomatic anemia 08/27/2020   PCP:  Rudene Anda, MD Pharmacy:   [id number] - [place], [place] [address] Phone: [phone] Fax: [phone]     Social Determinants  of Health (SDOH) Interventions    Readmission Risk Interventions No flowsheet data found.

## 2020-08-30 NOTE — Progress Notes (Signed)
Daily Progress Note   Patient Name: Kendra Dickson       Date: 08/30/2020 DOB: 1930/06/11  Age: 87 y.o. MRN#: [id number] Attending Physician: Shelly Coss, MD Primary Care Physician: Rudene Anda, MD Admit Date: 08/27/2020  Reason for Consultation/Follow-up: Establishing goals of care  Subjective: I met today with patient, her son, and her daughter in law in conjunction with Chrislyn King from [employer]. See below.  Length of Stay: 2  Current Medications: Scheduled Meds:  . feeding supplement  1 Container Oral BID BM  . levothyroxine  50 mcg Oral QAC breakfast    Continuous Infusions: . lactated ringers 100 mL/hr at 08/30/20 0340    PRN Meds: alum & mag hydroxide-simeth, morphine injection, ondansetron **OR** ondansetron (ZOFRAN) IV, polyvinyl alcohol  Physical Exam     General: Alert, awake, in no acute distress.  Frail and chronically ill appearing. HEENT: No bruits, no goiter, no JVD Heart: Regular rate and rhythm. No murmur appreciated. Lungs: Good air movement, clear Abdomen: Soft, nontender, nondistended, positive bowel sounds.   Ext: No significant edema Skin: Warm and dry      Vital Signs: BP (!) 143/71 (BP Location: Right Arm)   Pulse 70   Temp 98.1 F (36.7 C) (Oral)   Resp 16   Ht $R'4\' 9"'Cg$  (1.448 m)   Wt 38.5 kg   SpO2 95%   BMI 18.37 kg/m  SpO2: SpO2: 95 % O2 Device: O2 Device: Room Air O2 Flow Rate:    Intake/output summary:  Intake/Output Summary (Last 24 hours) at 08/30/2020 2259 Last data filed at 08/30/2020 1903 Gross per 24 hour  Intake 2973.92 ml  Output 4650 ml  Net -1676.08 ml   LBM: Last BM Date: 08/29/20 Baseline Weight: Weight: 38.5 kg Most recent weight: Weight: 38.5 kg       Palliative Assessment/Data:      Patient  Active Problem List   Diagnosis Date Noted  . Upper GI bleed 08/28/2020  . Symptomatic anemia 08/27/2020    Palliative Care Assessment & Plan   Patient Profile: 87 y.o. female  with past medical history of Chronic diarrhea, chronic abdominal pain, hypothyroidism, GERD admitted on 08/27/2020 with diarrhea and weakness.  She was reported to have darker stool for the past 10 days and was admitted for further management of GI bleed.  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  Biopsy returned revealing invasive adenocarcinoma.  Surgery has evaluated and patient had told surgery team this morning that she did not feel surgery was good option.  Palliative consulted for goals of care.  Recommendations/Plan: - Video interpreter utilized throughout encounter. - We had a long discussion regarding hospice services, care provided, and levels of hospice care including home vs residential hospice.  Discussed that I believe that she would qualify for home hospice but not residential hospice at this point. - Discussed concerns about nutrition moving forward. - Discussed potential for recurrence of bleeding and potential options if this recurs (maintain comfort at home, return to hospital, or attempt to transfer to residential hospice). - We discussed that in light of multiple chronic medical problems that have worsened with this acute problem, care should be focused on interventions that are likely to allow the patient to achieve goal of getting back to home and spending time with family. I discussed with family regarding heroic interventions at the end-of-life and they agree this would not be in line with prior expressed wishes for a natural death or be likely to lead to getting well enough to go back home. They were in agreement with changing CODE STATUS to DO NOT RESUSCITATE. - Plan to recheck H&H in AM.  If stable, plan for transition home with hospice.  If significantly lower, may want to  consider residential hospice if it appears she would qualify.  Code Status:    Code Status Orders  (From admission, onward)           Start     Ordered   08/30/20 1131  Do not attempt resuscitation (DNR)  Continuous       Question Answer Comment  In the event of cardiac or respiratory ARREST Do not call a "code blue"   In the event of cardiac or respiratory ARREST Do not perform Intubation, CPR, defibrillation or ACLS   In the event of cardiac or respiratory ARREST Use medication by any route, position, wound care, and other measures to relive pain and suffering. May use oxygen, suction and manual treatment of airway obstruction as needed for comfort.      08/30/20 1130           Code Status History     Date Active Date Inactive Code Status Order ID Comments User Context   08/27/2020 1700 08/30/2020 1130 Full Code [id number]  Jonnie Finner, DO Inpatient       Prognosis:  < 6 months  Discharge Planning: Home with Hospice  Care plan was discussed with patient, son, and DIL  Thank you for allowing the Palliative Medicine Team to assist in the care of this patient.   Time In: 1000 Time Out: 1115 Total Time 75 Prolonged Time Billed Yes      Greater than 50%  of this time was spent counseling and coordinating care related to the above assessment and plan.  Micheline Rough, MD  Please contact Palliative Medicine Team phone at [phone] for questions and concerns.

## 2020-08-30 NOTE — Progress Notes (Signed)
Initial Nutrition Assessment  DOCUMENTATION CODES:   Underweight  INTERVENTION:   -Boost Breeze po BID, each supplement provides 250 kcal and 9 grams of protein  NUTRITION DIAGNOSIS:   Increased nutrient needs related to catabolic illness, chronic illness as evidenced by estimated needs.  GOAL:   Patient will meet greater than or equal to 90% of their needs  MONITOR:   PO intake, Supplement acceptance, Diet advancement, Labs, Weight trends, I & O's, GOC  REASON FOR ASSESSMENT:   Consult Assessment of nutrition requirement/status  ASSESSMENT:   87 y.o. female with a hx of hypothyroidism and asthma, per chart as family states her only medical history is chronic diarrhea from lactose intolerance, who presented to the ED on 6/13 with melanotic diarrhea. Admitted for invasive adenocarcinoma of duodenal mass.  Patient currently having [place] meeting with palliative care. Will monitor for plans going forward after meeting. Per surgery, pt not a surgical candidate.  Pt currently on clear liquids, completing ~50% of trays. Accepting at least 1 Boost Breeze daily.  Per weight records, no weights recorded PTA since 2019.  Current weight: 84 lbs.  Medications: Lactated ringers  Labs reviewed.  NUTRITION - FOCUSED PHYSICAL EXAM:  Unable to complete   Diet Order:   Diet Order             Diet clear liquid Room service appropriate? Yes; Fluid consistency: Thin  Diet effective now                   EDUCATION NEEDS:   No education needs have been identified at this time  Skin:  Skin Assessment: Reviewed RN Assessment  Last BM:  6/15  Height:   Ht Readings from Last 1 Encounters:  08/27/20 4\' 9"  (1.448 m)    Weight:   Wt Readings from Last 1 Encounters:  08/27/20 38.5 kg    BMI:  Body mass index is 18.37 kg/m.  Estimated Nutritional Needs:   Kcal:  1250-1450  Protein:  55-70g  Fluid:  1.4L/day  Clayton Bibles, MS, RD, LDN Inpatient Clinical  Dietitian Contact information available via Amion

## 2020-08-31 LAB — TYPE AND SCREEN
ABO/RH(D): A POS
Antibody Screen: NEGATIVE
Unit division: 0
Unit division: 0
Unit division: 0
Unit division: 0

## 2020-08-31 LAB — BPAM RBC
Blood Product Expiration Date: 202207022359
Blood Product Expiration Date: 202207022359
Blood Product Expiration Date: 202207032359
Blood Product Expiration Date: 202207032359
ISSUE DATE / TIME: 202206131451
ISSUE DATE / TIME: 202206131451
Unit Type and Rh: 6200
Unit Type and Rh: 6200
Unit Type and Rh: 6200
Unit Type and Rh: 6200

## 2020-08-31 LAB — HEMOGLOBIN AND HEMATOCRIT, BLOOD
HCT: 27.9 % — ABNORMAL LOW (ref 36.0–46.0)
Hemoglobin: 9.1 g/dL — ABNORMAL LOW (ref 12.0–15.0)

## 2020-08-31 MED ORDER — OXYCODONE HCL 5 MG PO TABS: 5.0000 mg | ORAL_TABLET | Freq: Three times a day (TID) | ORAL | 0 refills | Status: AC | PRN

## 2020-08-31 MED ORDER — PANTOPRAZOLE SODIUM 40 MG PO TBEC: 40.0000 mg | DELAYED_RELEASE_TABLET | Freq: Two times a day (BID) | ORAL | 0 refills | Status: AC

## 2020-08-31 NOTE — Discharge Summary (Signed)
Physician Discharge Summary  Kendra Dickson QXI:[id number] DOB: [birth year] DOA: 08/27/2020  PCP: Rudene Anda, MD  Admit date: 08/27/2020 Discharge date: 08/31/2020  Admitted From: Home Disposition:  Home with hospice  Discharge Condition:Stable CODE STATUS:DNR Diet recommendation: Clear liquid   Brief/Interim Summary:  Patient is a 87 year old female with history of chronic diarrhea, chronic abdominal pain, hypothyroidism, GERD who presented with diarrhea and weakness from home.  She speaks [place].  As per the daughter at the bedside, she has issues with her abdomen since several years.  Patient reported epigastric pain on presentation.  There was also report of darker stool for last 10 days.  Patient does not take NSAIDs or aspirin.  On presentation her hemoglobin was in the range of 8.  FOBT was positive.  She was admitted for the management of GI bleed.  GI consulted and she underwent EGD with finding of fungating, infiltrative ulcerated mass in the duodenum.  General surgery consulted for surgical options but there is limited choice and she is not an appropriate candidate.  Pathology showed invasive adenocarcinoma.  Goals of care discussed.  Palliative care consulted.  Current plan is to discharge her home with hospice.  She will continue clear liquid diet and PPI twice daily indefinitely.   Discharge Diagnoses:  Active Problems:   Symptomatic anemia   Upper GI bleed    Discharge Instructions  Discharge Instructions     Diet clear liquid   Complete by: As directed    Discharge instructions   Complete by: As directed    1)Please take prescribed medications as instructed 2)Follow up with hospice at home   Increase activity slowly   Complete by: As directed       Allergies as of 08/31/2020   No Known Allergies      Medication List     STOP taking these medications    aspirin 81 MG chewable tablet   glycopyrrolate 1 MG tablet Commonly known as: ROBINUL       TAKE  these medications    CENTRUM SILVER PO Take 1 tablet by mouth daily.   OMEGA-3 PO Take 1 capsule by mouth daily.   oxyCODONE 5 MG immediate release tablet Commonly known as: Roxicodone Take 1 tablet (5 mg total) by mouth every 8 (eight) hours as needed.   pantoprazole 40 MG tablet Commonly known as: PROTONIX Take 1 tablet (40 mg total) by mouth 2 (two) times daily. What changed: when to take this   polyvinyl alcohol 1.4 % ophthalmic solution Commonly known as: LIQUIFILM TEARS Place 1 drop into both eyes as needed for dry eyes.       ASK your doctor about these medications    levothyroxine 50 MCG tablet Commonly known as: SYNTHROID TAKE 1 TABLET BY MOUTH DAILY BEFORE BREAKFAST   traZODone 50 MG tablet Commonly known as: DESYREL TAKE 1 TABLET BY MOUTH EVERYDAY AT BEDTIME        No Known Allergies  Consultations: Palliative care,surgery,GI   Procedures/Studies: DG Chest 2 View  Result Date: 08/27/2020 CLINICAL DATA:  Shortness of breath this morning EXAM: CHEST - 2 VIEW COMPARISON:  None. FINDINGS: Borderline heart size. Negative aortic and hilar contours. There is no edema, consolidation, effusion, or pneumothorax. Multiple remote compression fractures in the upper lumbar and thoracic spine with 2 level cement augmentation. Pronounced osteopenia. IMPRESSION: No evidence of acute cardiopulmonary disease. Electronically Signed   By: Monte Fantasia M.D.   On: 08/27/2020 07:12   CT Abdomen Pelvis W Contrast  Result Date: 08/27/2020 CLINICAL DATA:  Abdominal distension. Abdominal pain and diarrhea for 3 days EXAM: CT ABDOMEN AND PELVIS WITH CONTRAST TECHNIQUE: Multidetector CT imaging of the abdomen and pelvis was performed using the standard protocol following bolus administration of intravenous contrast. CONTRAST:  6mL OMNIPAQUE IOHEXOL 300 MG/ML  SOLN COMPARISON:  None. FINDINGS: Lower chest: Borderline heart size. Extensive aortic and coronary atherosclerosis. Bands  of scarring in the bilateral bases. Hepatobiliary: Cholecystectomy which likely accounts for mild intrahepatic biliary dilatation given normal biliary labs.Simple cyst in the caudate lobe measuring 15 mm. Pancreas: Subcentimeter simple cyst in the uncinate process and head without ductal dilatation, considered incidental and non worrisome given patient age. Spleen: Unremarkable. Adrenals/Urinary Tract: Negative adrenals. No hydronephrosis or stone. Unremarkable bladder. Stomach/Bowel: Numerous colonic diverticula. Colonic fluid levels are seen to the descending segment diffuse bowel gas which may be ingested. Oral contrast reaches the proximal colon. Vascular/Lymphatic: No acute vascular abnormality. Atheromatous calcification of the aorta and iliacs which is extensive. The aorta is tortuous with aneurysmal enlargement below the renal arteries up to 4.1 Cm. Recommend follow-up every 12 months and vascular consultation. This recommendation follows ACR consensus guidelines: White Paper of the ACR Incidental Findings Committee II on Vascular Findings. J Am Coll Radiol 2013; 10:789-794. No mass or adenopathy. Reproductive:Unremarkable for age Other: No ascites or pneumoperitoneum. Musculoskeletal: Generalized osteopenia. Remote compression fractures of T12-L3 with cement augmentation at T12 and L1. No acute osseous finding IMPRESSION: 1. Diffuse small bowel gas without obstruction or visible inflammation. 2. Extensive atherosclerosis including the coronary arteries. Fusiform abdominal aortic aneurysm measuring up to 4.1 cm. If appropriate for comorbidities, follow-up recommendations are above. 3. Left colonic diverticula. Electronically Signed   By: Monte Fantasia M.D.   On: 08/27/2020 10:48      Subjective: Patient seen and examined the bedside this morning.  Hemodynamically stable.  Overall comfortable.  Denies any new complaints.  Discharge Exam: Vitals:   08/31/20 0601 08/31/20 1235  BP: (!) 108/54 (!)  152/65  Pulse: 64 67  Resp: 18 17  Temp: 98.4 F (36.9 C) 98.3 F (36.8 C)  SpO2: 98% 98%   Vitals:   08/30/20 1329 08/30/20 2039 08/31/20 0601 08/31/20 1235  BP: 127/67 (!) 143/71 (!) 108/54 (!) 152/65  Pulse: 81 70 64 67  Resp: 16  18 17   Temp: 98.1 F (36.7 C) 98.1 F (36.7 C) 98.4 F (36.9 C) 98.3 F (36.8 C)  TempSrc: Oral Oral  Oral  SpO2: 98% 95% 98% 98%  Weight:      Height:        General: Pt is alert, awake, not in acute distress, cachectic,malnourished Cardiovascular: RRR, S1/S2 +, no rubs, no gallops Respiratory: CTA bilaterally, no wheezing, no rhonchi Abdominal: Soft, NT, ND, bowel sounds + Extremities: no edema, no cyanosis    The results of significant diagnostics from this hospitalization (including imaging, microbiology, ancillary and laboratory) are listed below for reference.     Microbiology: Recent Results (from the past 240 hour(s))  Resp Panel by RT-PCR (Flu A&B, Covid) Nasopharyngeal Swab     Status: None   Collection Time: 08/27/20  7:56 AM   Specimen: Nasopharyngeal Swab; Nasopharyngeal(NP) swabs in vial transport medium  Result Value Ref Range Status   SARS Coronavirus 2 by RT PCR NEGATIVE NEGATIVE Final    Comment: (NOTE) SARS-CoV-2 target nucleic acids are NOT DETECTED.  The SARS-CoV-2 RNA is generally detectable in upper respiratory specimens during the acute phase of infection. The lowest concentration of  SARS-CoV-2 viral copies this assay can detect is 138 copies/mL. A negative result does not preclude SARS-Cov-2 infection and should not be used as the sole basis for treatment or other patient management decisions. A negative result may occur with  improper specimen collection/handling, submission of specimen other than nasopharyngeal swab, presence of viral mutation(s) within the areas targeted by this assay, and inadequate number of viral copies(<138 copies/mL). A negative result must be combined with clinical observations,  patient history, and epidemiological information. The expected result is Negative.  Fact Sheet for Patients:  EntrepreneurPulse.com.au  Fact Sheet for Healthcare Providers:  IncredibleEmployment.be  This test is no t yet approved or cleared by the [place] FDA and  has been authorized for detection and/or diagnosis of SARS-CoV-2 by FDA under an Emergency Use Authorization (EUA). This EUA will remain  in effect (meaning this test can be used) for the duration of the COVID-19 declaration under Section 564(b)(1) of the Act, 21 U.S.C.section 360bbb-3(b)(1), unless the authorization is terminated  or revoked sooner.       Influenza A by PCR NEGATIVE NEGATIVE Final   Influenza B by PCR NEGATIVE NEGATIVE Final    Comment: (NOTE) The Xpert Xpress SARS-CoV-2/FLU/RSV plus assay is intended as an aid in the diagnosis of influenza from Nasopharyngeal swab specimens and should not be used as a sole basis for treatment. Nasal washings and aspirates are unacceptable for Xpert Xpress SARS-CoV-2/FLU/RSV testing.  Fact Sheet for Patients: EntrepreneurPulse.com.au  Fact Sheet for Healthcare Providers: IncredibleEmployment.be  This test is not yet approved or cleared by the [place] FDA and has been authorized for detection and/or diagnosis of SARS-CoV-2 by FDA under an Emergency Use Authorization (EUA). This EUA will remain in effect (meaning this test can be used) for the duration of the COVID-19 declaration under Section 564(b)(1) of the Act, 21 U.S.C. section 360bbb-3(b)(1), unless the authorization is terminated or revoked.  Performed at [facility], [place] [address] [place], [place] [address]      Labs: BNP (last 3 results) No results for input(s): BNP in the last 8760 hours. Basic Metabolic Panel: Recent Labs  Lab 08/27/20 0705 08/28/20 0032 08/29/20 0536  NA 139 137 140  K  5.2* 3.7 3.7  CL 108 108 109  CO2 25 26 26   GLUCOSE 171* 82 101*  BUN 46* 17 8  CREATININE 0.59 0.50 0.51  CALCIUM 8.4* 7.7* 8.1*   Liver Function Tests: Recent Labs  Lab 08/27/20 0705 08/28/20 0032  AST 16 20  ALT 10 12  ALKPHOS 41 35*  BILITOT 0.4 0.8  PROT 5.4* 4.6*  ALBUMIN 2.9* 2.5*   Recent Labs  Lab 08/27/20 0705  LIPASE 50   No results for input(s): AMMONIA in the last 168 hours. CBC: Recent Labs  Lab 08/27/20 0705 08/27/20 1757 08/28/20 0032 08/29/20 0536 08/31/20 0521  WBC 9.1  --  5.9 6.1  --   NEUTROABS  --   --   --  3.7  --   HGB 7.5* 9.3* 8.6* 9.3* 9.1*  HCT 23.3* 28.6* 25.6* 28.3* 27.9*  MCV 97.1  --  89.8 90.4  --   PLT 221  --  136* 152  --    Cardiac Enzymes: No results for input(s): CKTOTAL, CKMB, CKMBINDEX, TROPONINI in the last 168 hours. BNP: Invalid input(s): POCBNP CBG: No results for input(s): GLUCAP in the last 168 hours. D-Dimer No results for input(s): DDIMER in the last 72 hours. Hgb A1c No results for input(s):  HGBA1C in the last 72 hours. Lipid Profile No results for input(s): CHOL, HDL, LDLCALC, TRIG, CHOLHDL, LDLDIRECT in the last 72 hours. Thyroid function studies No results for input(s): TSH, T4TOTAL, T3FREE, THYROIDAB in the last 72 hours.  Invalid input(s): FREET3 Anemia work up No results for input(s): VITAMINB12, FOLATE, FERRITIN, TIBC, IRON, RETICCTPCT in the last 72 hours. Urinalysis    Component Value Date/Time   COLORURINE YELLOW 08/27/2020 1200   APPEARANCEUR CLEAR 08/27/2020 1200   APPEARANCEUR Cloudy (A) 04/29/2017 1741   LABSPEC 1.023 08/27/2020 1200   PHURINE 7.0 08/27/2020 1200   GLUCOSEU NEGATIVE 08/27/2020 1200   HGBUR LARGE (A) 08/27/2020 1200   BILIRUBINUR NEGATIVE 08/27/2020 1200   BILIRUBINUR Negative 04/29/2017 1741   KETONESUR NEGATIVE 08/27/2020 1200   PROTEINUR NEGATIVE 08/27/2020 1200   NITRITE NEGATIVE 08/27/2020 1200   LEUKOCYTESUR NEGATIVE 08/27/2020 1200   Sepsis Labs Invalid  input(s): PROCALCITONIN,  WBC,  LACTICIDVEN Microbiology Recent Results (from the past 240 hour(s))  Resp Panel by RT-PCR (Flu A&B, Covid) Nasopharyngeal Swab     Status: None   Collection Time: 08/27/20  7:56 AM   Specimen: Nasopharyngeal Swab; Nasopharyngeal(NP) swabs in vial transport medium  Result Value Ref Range Status   SARS Coronavirus 2 by RT PCR NEGATIVE NEGATIVE Final    Comment: (NOTE) SARS-CoV-2 target nucleic acids are NOT DETECTED.  The SARS-CoV-2 RNA is generally detectable in upper respiratory specimens during the acute phase of infection. The lowest concentration of SARS-CoV-2 viral copies this assay can detect is 138 copies/mL. A negative result does not preclude SARS-Cov-2 infection and should not be used as the sole basis for treatment or other patient management decisions. A negative result may occur with  improper specimen collection/handling, submission of specimen other than nasopharyngeal swab, presence of viral mutation(s) within the areas targeted by this assay, and inadequate number of viral copies(<138 copies/mL). A negative result must be combined with clinical observations, patient history, and epidemiological information. The expected result is Negative.  Fact Sheet for Patients:  EntrepreneurPulse.com.au  Fact Sheet for Healthcare Providers:  IncredibleEmployment.be  This test is no t yet approved or cleared by the [place] FDA and  has been authorized for detection and/or diagnosis of SARS-CoV-2 by FDA under an Emergency Use Authorization (EUA). This EUA will remain  in effect (meaning this test can be used) for the duration of the COVID-19 declaration under Section 564(b)(1) of the Act, 21 U.S.C.section 360bbb-3(b)(1), unless the authorization is terminated  or revoked sooner.       Influenza A by PCR NEGATIVE NEGATIVE Final   Influenza B by PCR NEGATIVE NEGATIVE Final    Comment: (NOTE) The Xpert  Xpress SARS-CoV-2/FLU/RSV plus assay is intended as an aid in the diagnosis of influenza from Nasopharyngeal swab specimens and should not be used as a sole basis for treatment. Nasal washings and aspirates are unacceptable for Xpert Xpress SARS-CoV-2/FLU/RSV testing.  Fact Sheet for Patients: EntrepreneurPulse.com.au  Fact Sheet for Healthcare Providers: IncredibleEmployment.be  This test is not yet approved or cleared by the [place] FDA and has been authorized for detection and/or diagnosis of SARS-CoV-2 by FDA under an Emergency Use Authorization (EUA). This EUA will remain in effect (meaning this test can be used) for the duration of the COVID-19 declaration under Section 564(b)(1) of the Act, 21 U.S.C. section 360bbb-3(b)(1), unless the authorization is terminated or revoked.  Performed at [facility], [place] [address], [place], [place] [address]     Please note: You  were cared for by a hospitalist during your hospital stay. Once you are discharged, your primary care physician will handle any further medical issues. Please note that NO REFILLS for any discharge medications will be authorized once you are discharged, as it is imperative that you return to your primary care physician (or establish a relationship with a primary care physician if you do not have one) for your post hospital discharge needs so that they can reassess your need for medications and monitor your lab values.    Time coordinating discharge: 40 minutes  SIGNED:   Shelly Coss, MD  [facility] 08/31/2020, 1:20 PM Pager [phone]  If 7PM-7AM, please contact night-coverage [email or web address] Password [email or web address]

## 2020-08-31 NOTE — TOC Transition Note (Signed)
Transition of Care [facility]) - CM/SW Discharge Note   Patient Details  Name: Kendra Dickson MRN: [id number] Date of Birth: 02-28-1931  [facility] CM/SW Contact:  Lennart Pall, LCSW Phone Number: 08/31/2020, 1:23 PM   Clinical Narrative:     Pt medically cleared for dc home today - pt/family aware and agreeable.  [place] to follow pt in the home.  Confirmed all needed DME has been delivered.  PTAR called at 1:25pm with anticipated wait of 1 hr.  No further TOC needs.  Final next level of care: Home w Hospice Care Barriers to Discharge: Barriers Resolved   Patient Goals and CMS Choice Patient states their goals for this hospitalization and ongoing recovery are:: return home      Discharge Placement                  Name of family member notified: son    Discharge Plan and Services In-house Referral: Clinical Social Work              DME Arranged: Systems developer A DME Agency: [facility]       HH Arranged: RN [place] Agency: [facility] Date [place]: 08/30/20      Social Determinants of Health (SDOH) Interventions     Readmission Risk Interventions No flowsheet data found.

## 2020-08-31 NOTE — Progress Notes (Signed)
AVS reviewed with pts son at bedside. Pts son will take all pt belongings home. All questions answered. Awaiting transport.

## 2020-10-10 ENCOUNTER — Telehealth: Payer: Self-pay | Admitting: Hematology

## 2020-10-10 NOTE — Telephone Encounter (Signed)
Received a staff msg from Kendra Apa, RN stating she received a call from [place] at [facility] for a referral. Ms. Kendra Dickson was in hospice but she has been improving and needed to be scheduled to see an oncologist. Ms. Kendra Dickson has been scheduled to see Dr. Burr Medico on 8/4 at 3pm. Appt date and time has been given to the pt's daughter. Aware to arrive 15 minutes early.

## 2020-10-15 ENCOUNTER — Telehealth: Payer: Self-pay

## 2020-10-15 NOTE — Telephone Encounter (Signed)
(  5:20 pm) SW scheduled initial palliative care visit with patient's daughter for 10/23/20 '@1'$  pm.

## 2020-10-18 ENCOUNTER — Other Ambulatory Visit: Payer: Self-pay

## 2020-10-18 ENCOUNTER — Encounter: Payer: Self-pay | Admitting: Hematology

## 2020-10-18 ENCOUNTER — Inpatient Hospital Stay: Payer: BLUE CROSS/BLUE SHIELD | Attending: Hematology | Admitting: Hematology

## 2020-10-18 VITALS — BP 119/73 | HR 90 | Temp 98.0°F | Resp 16 | Ht <= 58 in | Wt 75.8 lb

## 2020-10-18 DIAGNOSIS — D500 Iron deficiency anemia secondary to blood loss (chronic): Secondary | ICD-10-CM

## 2020-10-18 DIAGNOSIS — C170 Malignant neoplasm of duodenum: Secondary | ICD-10-CM | POA: Insufficient documentation

## 2020-10-18 NOTE — Progress Notes (Signed)
[place]   [phone] Fax:([phone]) [place] Note   Patient Care Team: Rudene Anda, MD as PCP - General (Internal Medicine)  Date of Service:  10/18/2020   CHIEF COMPLAINTS/PURPOSE OF CONSULTATION:  Duodenal cancer  REFERRING PHYSICIAN:  Dr. Ronnette Juniper  Oncology History  Carcinoma of duodenum [name]  08/27/2020 Imaging   CT AP  IMPRESSION: 1. Diffuse small bowel gas without obstruction or visible inflammation. 2. Extensive atherosclerosis including the coronary arteries. Fusiform abdominal aortic aneurysm measuring up to 4.1 cm. If appropriate for comorbidities, follow-up recommendations are above. 3. Left colonic diverticula.   08/27/2020 Procedure   Upper GI Endoscopy, Dr. Therisa Doyne  Impression: - Normal esophagus. - Z-line regular, 38 cm from the incisors. - Normal stomach. 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   08/27/2020 Pathology Results   FINAL MICROSCOPIC DIAGNOSIS:   A. DUODENUM, MASS, BIOPSY:  - Invasive adenocarcinoma arising in an adenomatous lesion.    10/18/2020 Initial Diagnosis   Carcinoma of duodenum ([name])      HISTORY OF PRESENTING ILLNESS:  Kendra Dickson 87 y.o. female is a here because of duodenal cancer. The patient was referred by Dr. Therisa Doyne. The patient presents to the clinic today accompanied by her daughter-in-law.   She initially presented to the ED on 08/27/20 with abdominal pain. She was also found to have rectal bleeding on physical exam and hgb of 7.5. CT AP showed diffuse small bowel gas without obstruction or visible inflammation. She proceeded to upper endoscopy that day under Dr. Therisa Doyne showing a likely malignant duodenal mass. Biopsy of the mass was obtained and revealed invasive adenocarcinoma arising in an adenomatous lesion.  Due to her advanced age, she was felt not a candidate for Whipple surgery or cancer treatment, and was discharged home on hospice.  However her overall condition  has been stable, she was released from hospice lately, and was referred to [name].   She will put on liquid and soft diet on hospital discharge, she is overall tolerating well, only had a 1 episode of vomiting a few days ago.  Per her daughter-in-law, she has chronic gastric issue, with history of gastric ulcer, has been on PPI for a long time, and upper endoscopy 2 years ago was negative.  She is able to do all ADLs today, does move around in her house, also has not been going out much.  She walked into the exam room by herself today.  Socially... -Her father and sister had pancreatic cancer -She had three children, two of which have already passed away.    REVIEW OF SYSTEMS:    Constitutional: Denies fevers, chills or abnormal night sweats Eyes: Denies blurriness of vision, double vision or watery eyes Ears, nose, mouth, throat, and face: Denies mucositis or sore throat Respiratory: Denies cough, dyspnea or wheezes Cardiovascular: Denies palpitation, chest discomfort or lower extremity swelling Gastrointestinal:  Denies nausea, heartburn or change in bowel habits Skin: Denies abnormal skin rashes Lymphatics: Denies new lymphadenopathy or easy bruising Neurological:Denies numbness, tingling or new weaknesses Behavioral/Psych: Mood is stable, no new changes  All other systems were reviewed with the patient and are negative.   MEDICAL HISTORY:  Past Medical History:  Diagnosis Date   Arthritis    Asthma    Hypothyroidism     SURGICAL HISTORY: Past Surgical History:  Procedure Laterality Date   APPENDECTOMY     BIOPSY  08/27/2020   Procedure: BIOPSY;  Surgeon: Ronnette Juniper, MD;  Location:  [facility];  Service: Gastroenterology;;   CESAREAN SECTION     ESOPHAGOGASTRODUODENOSCOPY (EGD) WITH PROPOFOL N/A 08/27/2020   Procedure: ESOPHAGOGASTRODUODENOSCOPY (EGD) WITH PROPOFOL;  Surgeon: Ronnette Juniper, MD;  Location: [facility];  Service: Gastroenterology;  Laterality: N/A;   EYE SURGERY      SPINE SURGERY      SOCIAL HISTORY: Social History   Socioeconomic History   Marital status: Widowed    Spouse name: Not on file   Number of children: 3   Years of education: Not on file   Highest education level: Not on file  Occupational History   Not on file  Tobacco Use   Smoking status: Never   Smokeless tobacco: Never  Vaping Use   Vaping Use: Never used  Substance and Sexual Activity   Alcohol use: No   Drug use: No   Sexual activity: Never  Other Topics Concern   Not on file  Social History Narrative   She is from [place]. Moved here in 2017. Lives with son and daughter in law. Has three children.    Social Determinants of Health   Financial Resource Strain: Not on file  Food Insecurity: Not on file  Transportation Needs: Not on file  Physical Activity: Not on file  Stress: Not on file  Social Connections: Not on file  Intimate Partner Violence: Not on file    FAMILY HISTORY: Family History  Problem Relation Age of Onset   Pancreatic cancer Father    Pancreatic cancer Sister     ALLERGIES:  has No Known Allergies.  MEDICATIONS:  Current Outpatient Medications  Medication Sig Dispense Refill   levothyroxine (SYNTHROID) 50 MCG tablet TAKE 1 TABLET BY MOUTH DAILY BEFORE BREAKFAST (Patient taking differently: Take 50 mcg by mouth daily before breakfast.) 90 tablet 0   Multiple Vitamins-Minerals (CENTRUM SILVER PO) Take 1 tablet by mouth daily.     Omega-3 Fatty Acids (OMEGA-3 PO) Take 1 capsule by mouth daily.     oxyCODONE (ROXICODONE) 5 MG immediate release tablet Take 1 tablet (5 mg total) by mouth every 8 (eight) hours as needed. 15 tablet 0   pantoprazole (PROTONIX) 40 MG tablet Take 1 tablet (40 mg total) by mouth 2 (two) times daily. 60 tablet 0   polyvinyl alcohol (LIQUIFILM TEARS) 1.4 % ophthalmic solution Place 1 drop into both eyes as needed for dry eyes.     traZODone (DESYREL) 50 MG tablet TAKE 1 TABLET BY MOUTH EVERYDAY AT BEDTIME (Patient  taking differently: Take 50 mg by mouth at bedtime.) 90 tablet 0   No current facility-administered medications for this visit.    PHYSICAL EXAMINATION: ECOG PERFORMANCE STATUS: 2 - Symptomatic, <50% confined to bed  Vitals:   10/18/20 1522  BP: 119/73  Pulse: 90  Resp: 16  Temp: 98 F (36.7 C)  SpO2: 99%   Filed Weights   10/18/20 1522  Weight: 75 lb 12.8 oz (34.4 kg)    GENERAL:alert, no distress and comfortable SKIN: skin color, texture, turgor are normal, no rashes or significant lesions EYES: normal, Conjunctiva are pink and non-injected, sclera clear  NECK: supple, thyroid normal size, non-tender, without nodularity LYMPH:  no palpable lymphadenopathy in the cervical, axillary  LUNGS: clear to auscultation and percussion with normal breathing effort HEART: regular rate & rhythm and no murmurs and no lower extremity edema ABDOMEN:abdomen soft, and normal bowel sounds; (+) mild tenderness in epigastric area Musculoskeletal:no cyanosis of digits and no clubbing  NEURO: alert & oriented x 3 with  fluent speech, no focal motor/sensory deficits  LABORATORY DATA:  I have reviewed the data as listed CBC Latest Ref Rng & Units 08/31/2020 08/29/2020 08/28/2020  WBC 4.0 - 10.5 K/uL - 6.1 5.9  Hemoglobin 12.0 - 15.0 g/dL 9.1(L) 9.3(L) 8.6(L)  Hematocrit 36.0 - 46.0 % 27.9(L) 28.3(L) 25.6(L)  Platelets 150 - 400 K/uL - 152 136(L)    CMP Latest Ref Rng & Units 08/29/2020 08/28/2020 08/27/2020  Glucose 70 - 99 mg/dL 101(H) 82 171(H)  BUN 8 - 23 mg/dL 8 17 46(H)  Creatinine 0.44 - 1.00 mg/dL 0.51 0.50 0.59  Sodium 135 - 145 mmol/L 140 137 139  Potassium 3.5 - 5.1 mmol/L 3.7 3.7 5.2(H)  Chloride 98 - 111 mmol/L 109 108 108  CO2 22 - 32 mmol/L _0 Calcium 8.9 - 10.3 mg/dL 8.1(L) 7.7(L) 8.4(L)  Total Protein 6.5 - 8.1 g/dL - 4.6(L) 5.4(L)  Total Bilirubin 0.3 - 1.2 mg/dL - 0.8 0.4  Alkaline Phos 38 - 126 U/L - 35(L) 41  AST 15 - 41 U/L - 20 16  ALT 0 - 44 U/L - 12 10      RADIOGRAPHIC STUDIES: I have personally reviewed the radiological images as listed and agreed with the findings in the report. No results found.  ASSESSMENT & PLAN:  Kendra Dickson is a 87 y.o.  female with a history of gastric ulcer  1. Duodenal Adenocarcinoma, [id number] -She presented with abdominal pain and rectal bleeding on 08/27/20. Endoscopy revealed ulcerative duodenal mass, and biopsy confirmed adenocarcinoma.  CT abdomen pelvis was negative for metastatic disease.  I reviewed the imaging and pathology results with them today. -We discussed surgery is the only option to cure her cancer.  Due to her advanced age, she is not a candidate for Whipple surgery.  She was evaluated by general surgery in the hospital. -The goal of cancer treatment is palliative, to preserve her quality of life, but not to prolong her life, per pt and her family, which I totally agree.  -The main issue is partial small bowel obstruction from the duodenal mass, and amount of bleeding and anemia. -Given her age, I do not recommend chemotherapy. I will refer her for consideration of radiation therapy.  -I will also discuss with her GI about the option of duodenal stent placement -I will send her biopsy sample for additional molecular studies, including MMR and FO, to see if she is a candidate for immunotherapy or targeted therapy. -I discussed the option of G/J tube for venting and tube feeds, but I do not strong recommend. Patient is not interested.  -I also recommend genetic testing to see if she is a candidate for PARP inhibitor. -Nutrition consult   PLAN:  -referral to radiation oncology for palliative radiation -I will discuss with GI Dr. Therisa Doyne about duodenal stent placement -will order MMR and FO on her biopsy sample  -dietician consult  -urgent genetic referral and lab next week -will consider iv iron if needed  -f/u in 2-3 weeks    Orders Placed This Encounter  Procedures   CBC with  Differential ([facility] Only)    Standing Status:   Standing    Number of Occurrences:   20    Standing Expiration Date:   10/18/2021   CMP ([facility] only)    Standing Status:   Standing    Number of Occurrences:   20    Standing Expiration Date:   10/18/2021   Ferritin  Standing Status:   Standing    Number of Occurrences:   20    Standing Expiration Date:   10/18/2021   Iron and TIBC    Standing Status:   Standing    Number of Occurrences:   20    Standing Expiration Date:   10/18/2021   Ambulatory referral to Genetics    Referral Priority:   Urgent    Referral Type:   Consultation    Referral Reason:   Specialty Services Required    Number of Visits Requested:   1     All questions were answered. The patient knows to call the clinic with any problems, questions or concerns. The total time spent in the appointment was 60 minutes.     Truitt Merle, MD 10/18/2020   I, Wilburn Mylar, am acting as scribe for Truitt Merle, MD.   I have reviewed the above documentation for accuracy and completeness, and I agree with the above.

## 2020-10-19 ENCOUNTER — Telehealth: Payer: Self-pay | Admitting: Hematology

## 2020-10-19 DIAGNOSIS — C170 Malignant neoplasm of duodenum: Secondary | ICD-10-CM

## 2020-10-19 NOTE — Telephone Encounter (Signed)
Scheduled appt per 8/5 sch msg. Called pt's daughter, no answer. Left msg with appt date and time.

## 2020-10-22 ENCOUNTER — Inpatient Hospital Stay: Payer: BLUE CROSS/BLUE SHIELD

## 2020-10-22 ENCOUNTER — Other Ambulatory Visit: Payer: Self-pay | Admitting: Gastroenterology

## 2020-10-22 ENCOUNTER — Telehealth: Payer: Self-pay | Admitting: Hematology

## 2020-10-22 NOTE — Telephone Encounter (Signed)
Scheduled appts per 8/5 sch msg. Called pt, no answer. Left msg with appt date and time.

## 2020-10-23 ENCOUNTER — Other Ambulatory Visit: Payer: BLUE CROSS/BLUE SHIELD

## 2020-10-23 ENCOUNTER — Other Ambulatory Visit: Payer: BLUE CROSS/BLUE SHIELD | Admitting: *Deleted

## 2020-10-23 ENCOUNTER — Other Ambulatory Visit: Payer: Self-pay

## 2020-10-23 ENCOUNTER — Telehealth: Payer: Self-pay | Admitting: Hematology

## 2020-10-23 VITALS — BP 123/69 | HR 77 | Temp 97.7°F | Resp 16

## 2020-10-23 DIAGNOSIS — Z515 Encounter for palliative care: Secondary | ICD-10-CM

## 2020-10-23 NOTE — Telephone Encounter (Signed)
Sch per 8/5 los, spoke with pt daughter, is aware of upcoming appts, pt daughter did hang up before I could confirm all appts.

## 2020-10-23 NOTE — Progress Notes (Signed)
[facility] RN NOTE  PATIENT NAME: Kendra Dickson DOB: [birth year] MRN: [id number]  PRIMARY CARE PROVIDER: Rudene Anda, MD  RESPONSIBLE PARTY:  Acct ID - Guarantor Home Phone Work Phone Relationship Acct Type  [id number] [name], [name] [phone]  Self P/F     [address] [place], [place] [address]   Covid-19 Pre-screening Negative  PLAN OF CARE and INTERVENTION:  ADVANCE CARE PLANNING/GOALS OF CARE: Goal is for patient to remain in the home with her family and seek possible treatment for her duodenal cancer. She has a DNR. PATIENT/CAREGIVER EDUCATION: Explained palliative care services, symptom management DISEASE STATUS: Joint initial palliative care visit completed with LCSW, Monica Lonon. Met with patient and her grandson in their home. Patient recently revoked hospice services on 10/10/20 to seek further evaluation and possible treatment of duodenal cancer. Palliative care referral made to continue to follow patient. She had a recent appointment on 10/18/20 with her Oncologist to discuss options. Patient has a partial small bowel obstruction from her duodenal mass. Due to her age, chemotherapy is not recommended and she is not a candidate for Whipple surgery. Per progress note, Oncologist is referring her for consideration of radiation and to GI to discuss option of having a duodenal stent placed. The goal for any interventions are considered palliative, and not life prolonging. Grandson able to translate during visit. Patient denies pain today, but does experience abdominal pain at times if she eats too much. She eats about 7 times/day. She mainly eats soups, snack cookies and drinks Boost and Ensure. No nausea or vomiting and tolerating current diet. She is ambulatory short distances without assistive devices but uses her rollator for longer distances in the home. She is independent with ADLs and able to be left alone for a short period of time without concern. She is  continent of both bowel and bladder. She says that when she eats things with too much salt she notices that it is more difficult for her to urinate. Educated on how salt causes fluid retention and they are working on minimizing salt in her diet. Sometimes she wakes up and her hands feel tight. She does have trace edema noted to bilateral lower extremities but denies any tightness or discomfort. Patient is agreeable to receive future visits from palliative care.   HISTORY OF PRESENT ILLNESS: This is a 87 yo female with a diagnosis of duodenal cancer. She has a past medical history of upper GI bleed and symptomatic anemia. Palliative care has been asked to follow patient while patient is pursuing possible treatment for her cancer and additional support.   CODE STATUS: DNR ADVANCED DIRECTIVES: N MOST FORM: no PPS: 50%   PHYSICAL EXAM:   VITALS: Today's Vitals   10/23/20 1328  BP: 123/69  Pulse: 77  Resp: 16  Temp: 97.7 F (36.5 C)  TempSrc: Temporal  SpO2: 96%  PainSc: 0-No pain    LUNGS: clear to auscultation  CARDIAC: Cor RRR EXTREMITIES: Trace bilateral lower extremity edema SKIN:  Exposed skin is dry and intact   NEURO:  Alert and oriented x 3, ambulatory w/rollator walker   (Duration of visit and documentation 60 minutes)   Daryl Eastern, RN BSN

## 2020-10-24 ENCOUNTER — Inpatient Hospital Stay: Payer: BLUE CROSS/BLUE SHIELD | Admitting: Genetic Counselor

## 2020-10-24 LAB — SURGICAL PATHOLOGY

## 2020-10-25 ENCOUNTER — Other Ambulatory Visit: Payer: Self-pay

## 2020-10-25 DIAGNOSIS — C170 Malignant neoplasm of duodenum: Secondary | ICD-10-CM

## 2020-10-26 ENCOUNTER — Telehealth: Payer: Self-pay

## 2020-10-26 ENCOUNTER — Other Ambulatory Visit: Payer: Self-pay

## 2020-10-26 DIAGNOSIS — K922 Gastrointestinal hemorrhage, unspecified: Secondary | ICD-10-CM

## 2020-10-26 DIAGNOSIS — D649 Anemia, unspecified: Secondary | ICD-10-CM

## 2020-10-26 DIAGNOSIS — C170 Malignant neoplasm of duodenum: Secondary | ICD-10-CM

## 2020-10-26 NOTE — Telephone Encounter (Signed)
The pt has been scheduled for EGD at [facility] with GM on 11/07/20 at 39 am.    Office visit with GM on 10/31/20 at 3:50 pm  The pt son has been advised of the appts.  I have mailed a copy of the instructions to the pt home. The phone number and address has been given as well.

## 2020-10-26 NOTE — Progress Notes (Signed)
Opened in error

## 2020-10-26 NOTE — Telephone Encounter (Signed)
-----   Message from Irving Copas., MD sent at 10/25/2020  4:55 PM EDT ----- YF, Thanks for the update about radiation.  Cartha Rotert or Santiago Glad, please move forward with scheduling an upper GI series to try to define the stenosis/stricture.  Enrique Manganaro, if you can start looking at my upcoming hospital week and see if we can find a spot at lunchtime 02/1229 (23rd/24th/25th/26th are better days) for EGD 75-minute case fluoroscopy available enteral stent for if I have availability of slots still open. I would like to have an opportunity to talk with the patient/patient's family if possible and overbook clinic to discuss enteral stent placement and the risks associated with it. Thanks. GM ----- Message ----- From: Truitt Merle, MD Sent: 10/25/2020   4:53 PM EDT To: Milus Banister, MD, Timothy Lasso, RN, #  Linna Hoff and Chester Holstein,  Thanks for your help. She is on liquid and some soft diet, so it's not emergency, hopefully you can get her in 2-4 weeks. She has declined palliative RT  Thanks   Krista Blue  ----- Message ----- From: Royston Bake, RN Sent: 10/25/2020   1:26 PM EDT To: Milus Banister, MD, Timothy Lasso, RN, #  Hello all,  This patient was diagnosed with duodenal cancer who needs duodenal stent placed.  She was seen by Dr Therisa Doyne in hospital but [facility] is out of network.  Would one of you be able to place stent?  Thanks   Tollie Pizza

## 2020-10-26 NOTE — Telephone Encounter (Signed)
Patty, Thanks for helping arrange this so quickly. FYI YF and KB GM

## 2020-10-29 NOTE — Telephone Encounter (Signed)
Dr Rush Landmark per [place] Amy the pt insurance is does not participate with [facility].  See Amy's note below.  "Good morning! Letting you know that the [facility] system is non-participating with this patient's insurance.  She will be considered self pay."

## 2020-10-29 NOTE — Telephone Encounter (Signed)
Patty, Thank you for this update. This certainly complicates the situation. I spoke with Dr. Watt Climes, who had been willing to help with the duodenal stent but [facility] also did not participate with the insurance. As such, I think the patient will need to be seen at [facility] where she is previously seen GI in the past. Otherwise a self-pay for the endoscopy enteral stent and anesthesia will be quite significant of a cost and burden for the patient/family.  YF and KB I think we will have to take her off the list and she will probably need to follow-up with [facility], hopefully her insurance will take her there as she is seen Dr. Earlean Shawl in the past (2021) although he is now retiring and does not place enteral stents she will have to go to the big house at [facility] for any endoscopic procedures I suspect.  Patty, please let the patient's son know that I would be happy to still meet them this week to discuss enteral stenting, but they will be completely self-pay and enteral stenting without insurance will be a significant financial burden on the family/patient.   GM

## 2020-10-30 ENCOUNTER — Telehealth: Payer: Self-pay | Admitting: Nutrition

## 2020-10-30 ENCOUNTER — Inpatient Hospital Stay: Payer: BLUE CROSS/BLUE SHIELD | Admitting: Nutrition

## 2020-10-30 ENCOUNTER — Encounter: Payer: BLUE CROSS/BLUE SHIELD | Admitting: Nutrition

## 2020-10-30 NOTE — Telephone Encounter (Signed)
Patty, Thank you for the update. FYI.YF and KB.  GM

## 2020-10-30 NOTE — Progress Notes (Signed)
I spoke with Ms Hoberman daughter, Katheren Puller.  I let her know that we are not in her inusrance network as we are [place].  She requested referral to [facility] hospital for on going care.

## 2020-10-30 NOTE — Telephone Encounter (Signed)
I spoke with the pt son and advised that we are not in network with her insurance.  He asked to cancel the appt for tomorrow and he will call Dr Burr Medico and the insurance to find out what the next steps are and where she can be seen.  The appt has been cancelled as requested.

## 2020-10-30 NOTE — Telephone Encounter (Signed)
Patient is an 87 year old female diagnosed with duodenal cancer.  She does not speak English so telephone consult was completed with her daughter-in-law. Patient has declined palliative radiation therapy.  Recommendation is for an enteral/duodenal stent.  Past medical history includes arthritis, asthma, hypothyroidism,.  Medications include Synthroid, omega-3 fatty acids, Protonix.  Labs were reviewed.  Height: 4 feet 9 inches. Weight: 75.8 pounds. Usual body weight: 84 pounds in June 2022. BMI: 16.4. ECOG: 2.  Estimated nutrition needs: 1100-1300 cal, 40-55 g protein, 1.3 L fluid.  Noted patient with gastric ulcer and trace edema.  Daughter-in-law reports patient is able to eat soft foods and enjoys chewing the food.  She knows she must chew very well.  She is drinking about 1-1/2 cartons of oral nutrition supplement daily.  Nutrition diagnosis: Unintended weight loss related to duodenal cancer as evidenced by 10% weight loss in less than 3 months which is significant.  Intervention: Educated to continue very soft foods, chewing well as tolerated. Add liquids to ease swallowing as needed. Increase oral nutrition supplements to 2 cartons of Ensure Plus or equivalent daily. Questions answered.  Monitoring, evaluation, goals: Patient will tolerate adequate calories and protein for improved quality of life.  No follow-up scheduled.  **Disclaimer: This note was dictated with voice recognition software. Similar sounding words can inadvertently be transcribed and this note may contain transcription errors which may not have been corrected upon publication of note.**

## 2020-10-31 ENCOUNTER — Ambulatory Visit: Payer: BLUE CROSS/BLUE SHIELD | Admitting: Gastroenterology

## 2020-10-31 NOTE — Progress Notes (Signed)
I spoke to [employer] at [place], Dr [facility] office.  I explained that Ms Sego has been diagnosed with colon cancer and the recommendation was for duodenal stent placement.  I explained that [facility] is out of her insurance network and we were requesting that Dr Earlean Shawl continue her care as he has seen her this past spring.  I faxed Dr Ernestina Penna last ov and pathology report to [phone].  Amber will call to schedule her appt.

## 2020-10-31 NOTE — Progress Notes (Signed)
[facility] SW NOTE  PATIENT NAME: Kendra Dickson DOB: 1930-11-13 MRN: [id number]  PRIMARY CARE PROVIDER: Rudene Anda, MD  RESPONSIBLE PARTY:  Acct ID - Guarantor Home Phone Work Phone Relationship Acct Type  [id number] Kendra Dickson [id number]  Self P/F     [address], [place], [place] [address]     PLAN OF CARE and INTERVENTIONS:             GOALS OF CARE/ ADVANCE CARE PLANNING:  Kendra Dickson is for patient to remain in her home with her family. Patient is a DNR. SOCIAL/EMOTIONAL/SPIRITUAL ASSESSMENT/ INTERVENTIONS:  SW and RN-M. Nadara Mustard completed an initial visit with patient at her home where she was present with her grandson , who also served as Optometrist for the team and patient. The team provided education regarding the palliative care services, visit frequency and contact information.Patient provided  verbal consent for services. Patient reported that she has intermittently has feelings of dizziness. She ambulates independently in her room, but uses a rolator  outside the room. Patient's appetite is fair, but she does take Ensure. Patient eats small meals to include soup, cookies and Boost. Patient has acid reflux . She also has a small bowel obstruction from a duodenal mass, however she is not a candidate for surgery and chemotherapy is not recommended. Patient is sleeping well. She completes her own personal care and can be left alone for short periods of time. She is continent of bowel and bladder. Patient also report increased intermittent tightening of her hands and feet that she suspect is from increased salt in her diets from the soups she eats. The patient is open to ongoing support and visits from palliative care.  PATIENT/CAREGIVER EDUCATION/ COPING:  Patient appears to be coping well.  PERSONAL EMERGENCY PLAN:  911 can be activated for emergencies.  COMMUNITY RESOURCES COORDINATION/ HEALTH CARE NAVIGATION: None. FINANCIAL/LEGAL CONCERNS/INTERVENTIONS:  None      SOCIAL HX:  Social History   Tobacco Use   Smoking status: Never   Smokeless tobacco: Never  Substance Use Topics   Alcohol use: No    CODE STATUS: DNR ADVANCED DIRECTIVES: No MOST FORM COMPLETE: No HOSPICE EDUCATION PROVIDED: No  PPS: Patient is a alert and oriented x3. Patient is independent for personal care needs and is continent of bowel and bladder.  Duration of visit and documentation: 60 minutes  Katheren Puller, LCSW

## 2020-11-02 ENCOUNTER — Encounter (HOSPITAL_COMMUNITY): Admission: RE | Payer: Self-pay | Source: Home / Self Care

## 2020-11-02 ENCOUNTER — Ambulatory Visit (HOSPITAL_COMMUNITY)
Admission: RE | Admit: 2020-11-02 | Payer: BLUE CROSS/BLUE SHIELD | Source: Home / Self Care | Admitting: Gastroenterology

## 2020-11-02 SURGERY — EGD (ESOPHAGOGASTRODUODENOSCOPY)
Anesthesia: Monitor Anesthesia Care

## 2020-11-05 ENCOUNTER — Inpatient Hospital Stay: Payer: BLUE CROSS/BLUE SHIELD | Admitting: Hematology

## 2020-11-07 ENCOUNTER — Ambulatory Visit (HOSPITAL_COMMUNITY): Admit: 2020-11-07 | Payer: BLUE CROSS/BLUE SHIELD | Admitting: Gastroenterology

## 2020-11-07 ENCOUNTER — Encounter (HOSPITAL_COMMUNITY): Payer: Self-pay

## 2020-11-07 SURGERY — ESOPHAGOGASTRODUODENOSCOPY (EGD) WITH PROPOFOL
Anesthesia: Monitor Anesthesia Care

## 2020-11-22 ENCOUNTER — Telehealth: Payer: Self-pay

## 2020-11-22 NOTE — Telephone Encounter (Signed)
(  1:29 pm) SW returned call to patient's daughter-in-law. She advised that patient is vomiting everything she eats and she wants to take her primary doctor with [facility] instead of the recommended [facility]. SW advised her that patient can see any doctor that she or family see fit. The DIL questioned if it will interfere with palliative care and SW advised her that it did not. DIL to schedule a visit with patient's primary care physician.

## 2020-12-05 ENCOUNTER — Ambulatory Visit: Payer: BLUE CROSS/BLUE SHIELD | Admitting: Gastroenterology

## 2020-12-12 ENCOUNTER — Other Ambulatory Visit: Payer: BLUE CROSS/BLUE SHIELD | Admitting: *Deleted

## 2020-12-12 DIAGNOSIS — Z515 Encounter for palliative care: Secondary | ICD-10-CM

## 2020-12-12 NOTE — Progress Notes (Signed)
[facility]UN[facility] PALLIATIVE CARE RN NOTE  PATIENT NAME: Kendra Dickson DOB: 1930/12/20 MRN: [id number]  PRIMARY CARE PROVIDER: Rudene Anda, MD  RESPONSIBLE PARTY:  Acct ID - Guarantor Home Phone Work Phone Relationship Acct Type  [id number] NYSHA, KOPLIN [phone]  Self P/F     [address] [place] [address]   Due to the COVID-19 crisis, this virtual check-in visit was done via telephone from my office and it was initiated and consent by this patient and or family.  PLAN OF CARE and INTERVENTION:  ADVANCE CARE PLANNING/GOALS OF CARE: Goal is for patient to be as comfortable as possible. Daughter wants comfort measures only.  PATIENT/CAREGIVER EDUCATION: Hospice education DISEASE STATUS: Virtual check-in visit completed with patient's daughter via telephone. Daughter called [facility]'s hospice referral center requesting that patient be placed back under hospice care. Referral center requested I assess patient for hospice eligibility. 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 Daughter says the patient is getting worse by the day and they do not wish to have any treatment now for her cancer. She is getting more short of breath with any exertion. Just sitting up causes dyspnea. She is spending more time lying down in bed hardly moving due to progressive weakness and this shortness of breath. She still only drinks broth and daughter says she used to drink 75% 3x/day now only 50%. When she drinks this in the evening she starts to feel bad because it seems to just sit on her stomach and not go through. In my opinion, she is hospice eligible due to the fact she was eligible before, but decided to revoke for treatment, has now decided not to have treatment nor has had any treatment for her cancer since her revocation and her symptoms continues to worsen. I sent this information  over to our referral center who is going to work on getting a hospice admission scheduled. Daughter is Patent attorney.    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 She has a past medical history of upper GI bleed and symptomatic anemia. Palliative care performed assessment and feels that patient is eligible for hospice. Referral center working on scheduling hospice admission visit.  CODE STATUS: DNR ADVANCED DIRECTIVES: N MOST FORM: no PPS: 40%    (Duration of visit and documentation 20 minutes)   Daryl Eastern, RN BSN

## 2020-12-13 ENCOUNTER — Other Ambulatory Visit: Payer: Self-pay

## 2021-08-26 ENCOUNTER — Other Ambulatory Visit: Payer: Self-pay

## 2023-04-02 IMAGING — CR DG CHEST 2V
2 series · 2 of 2 positions shown · non-contrast
Comparison: None.

CLINICAL DATA: Shortness of breath this morning

EXAM:
CHEST - 2 VIEW

[x chest ap]
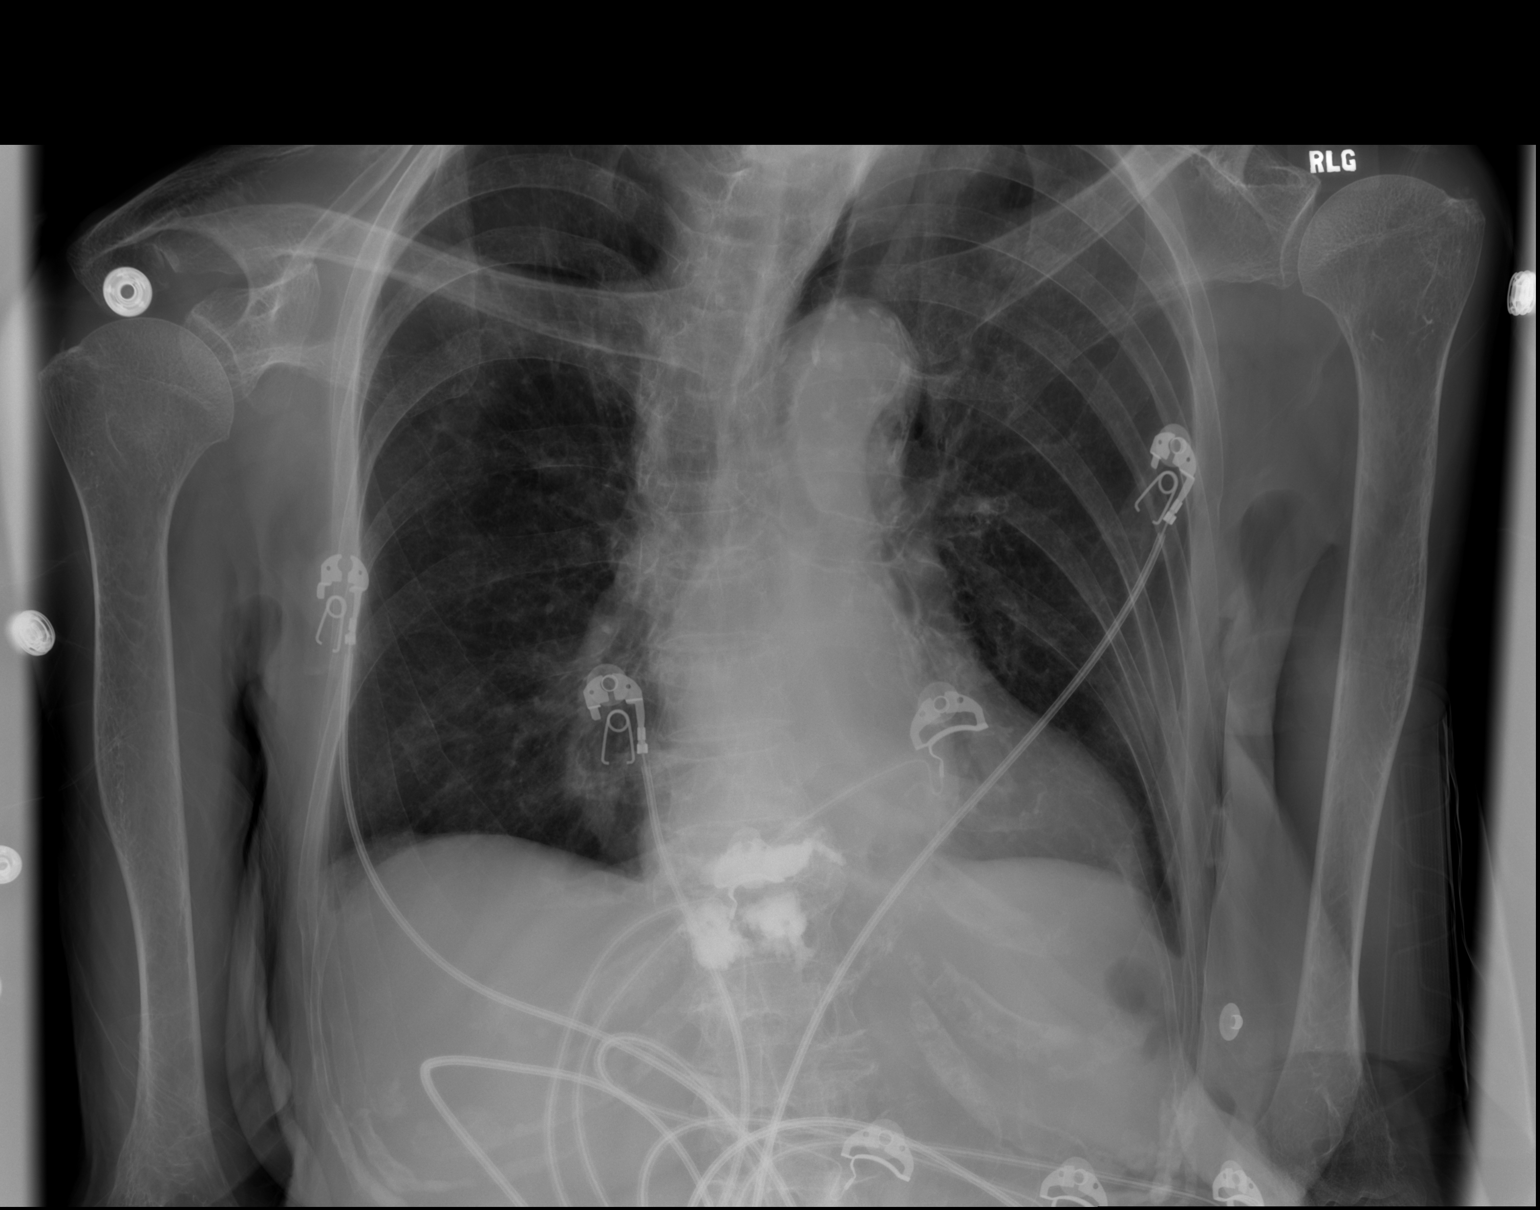

[w chest lat]
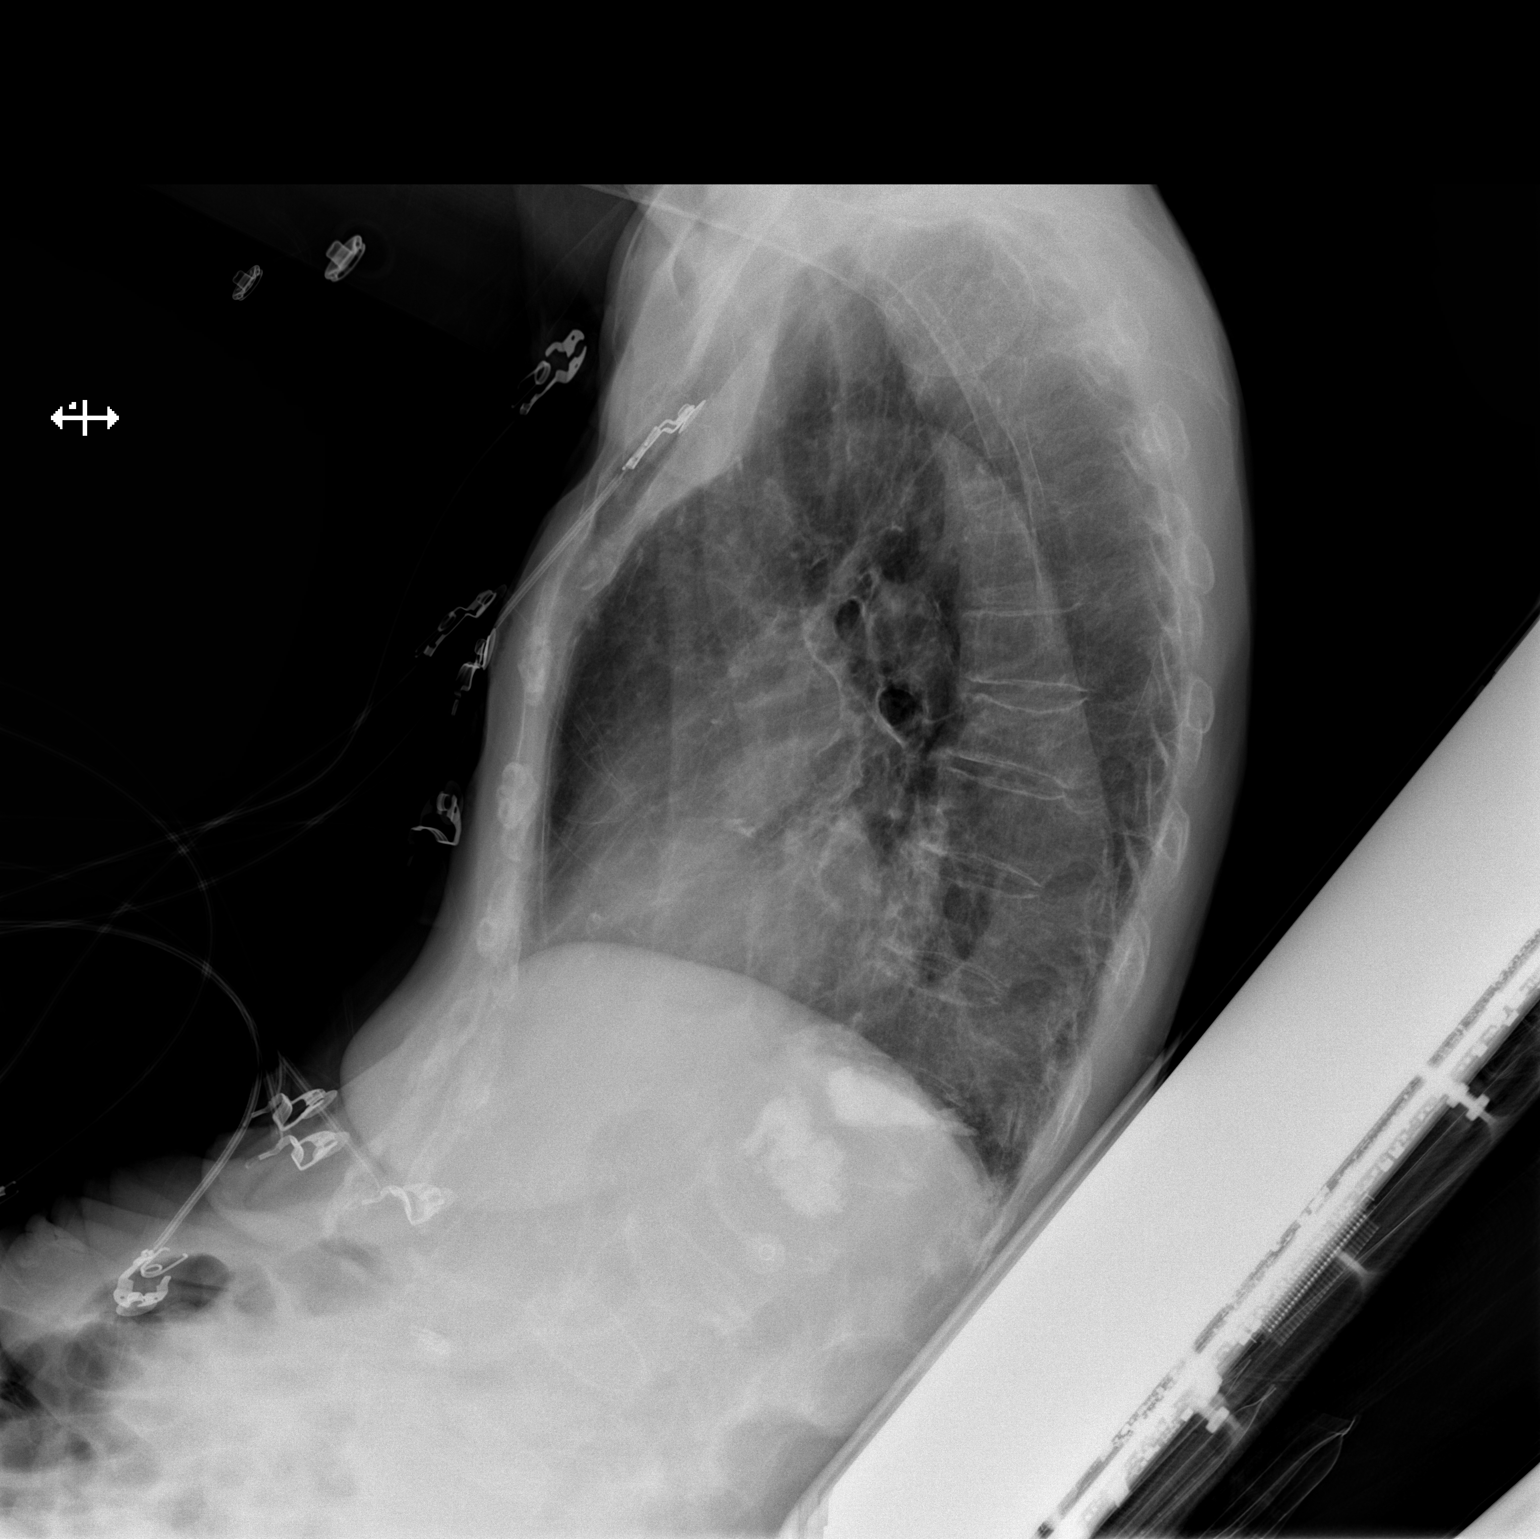

[2 of 2 positions shown; findings below may reference images not displayed]

FINDINGS: Borderline heart size. Negative aortic and hilar contours. There is
no edema, consolidation, effusion, or pneumothorax.

Multiple remote compression fractures in the upper lumbar and
thoracic spine with 2 level cement augmentation. Pronounced
osteopenia.
IMPRESSION: No evidence of acute cardiopulmonary disease.
